# Patient Record
Sex: Female | Born: 1980
Health system: Southern US, Community
[De-identification: ages and names within clinical notes are randomized; demographics above are authoritative.]

## PROBLEM LIST (undated history)

## (undated) DIAGNOSIS — G47419 Narcolepsy without cataplexy: Secondary | ICD-10-CM

## (undated) DIAGNOSIS — G4710 Hypersomnia, unspecified: Secondary | ICD-10-CM

## (undated) DIAGNOSIS — L732 Hidradenitis suppurativa: Secondary | ICD-10-CM

## (undated) DIAGNOSIS — T8339XA Other mechanical complication of intrauterine contraceptive device, initial encounter: Secondary | ICD-10-CM

## (undated) DIAGNOSIS — G4733 Obstructive sleep apnea (adult) (pediatric): Secondary | ICD-10-CM

## (undated) DIAGNOSIS — Z9989 Dependence on other enabling machines and devices: Secondary | ICD-10-CM

## (undated) DIAGNOSIS — R635 Abnormal weight gain: Secondary | ICD-10-CM

## (undated) DIAGNOSIS — J329 Chronic sinusitis, unspecified: Secondary | ICD-10-CM

## (undated) HISTORY — DX: Hidradenitis suppurativa: L73.2

## (undated) HISTORY — PX: OTHER SURGICAL HISTORY: SHX169

## (undated) HISTORY — DX: Abnormal weight gain: R63.5

## (undated) HISTORY — DX: Hypersomnia, unspecified: G47.10

## (undated) HISTORY — PX: TONSILLECTOMY: SUR1361

## (undated) HISTORY — DX: Chronic sinusitis, unspecified: J32.9

## (undated) HISTORY — DX: Obstructive sleep apnea (adult) (pediatric): G47.33

## (undated) HISTORY — DX: Obstructive sleep apnea (adult) (pediatric): Z99.89

## (undated) HISTORY — DX: Narcolepsy without cataplexy: G47.419

---

## 1999-07-18 ENCOUNTER — Other Ambulatory Visit: Admission: RE | Admit: 1999-07-18 | Discharge: 1999-07-18 | Payer: Self-pay | Admitting: *Deleted

## 2000-08-13 ENCOUNTER — Other Ambulatory Visit: Admission: RE | Admit: 2000-08-13 | Discharge: 2000-08-13 | Payer: Self-pay | Admitting: Family Medicine

## 2001-06-10 ENCOUNTER — Ambulatory Visit (HOSPITAL_COMMUNITY): Admission: RE | Admit: 2001-06-10 | Discharge: 2001-06-10 | Payer: Self-pay | Admitting: Obstetrics and Gynecology

## 2001-06-10 ENCOUNTER — Encounter: Payer: Self-pay | Admitting: Obstetrics and Gynecology

## 2001-07-08 ENCOUNTER — Encounter: Payer: Self-pay | Admitting: Obstetrics and Gynecology

## 2001-07-08 ENCOUNTER — Ambulatory Visit (HOSPITAL_COMMUNITY): Admission: RE | Admit: 2001-07-08 | Discharge: 2001-07-08 | Payer: Self-pay | Admitting: Obstetrics and Gynecology

## 2001-09-16 ENCOUNTER — Encounter: Payer: Self-pay | Admitting: Obstetrics and Gynecology

## 2001-09-16 ENCOUNTER — Ambulatory Visit (HOSPITAL_COMMUNITY): Admission: RE | Admit: 2001-09-16 | Discharge: 2001-09-16 | Payer: Self-pay | Admitting: Obstetrics and Gynecology

## 2001-11-02 ENCOUNTER — Inpatient Hospital Stay (HOSPITAL_COMMUNITY): Admission: AD | Admit: 2001-11-02 | Discharge: 2001-11-02 | Payer: Self-pay | Admitting: Obstetrics and Gynecology

## 2001-11-07 ENCOUNTER — Inpatient Hospital Stay (HOSPITAL_COMMUNITY): Admission: AD | Admit: 2001-11-07 | Discharge: 2001-11-07 | Payer: Self-pay | Admitting: Obstetrics and Gynecology

## 2001-11-11 ENCOUNTER — Inpatient Hospital Stay (HOSPITAL_COMMUNITY): Admission: AD | Admit: 2001-11-11 | Discharge: 2001-11-11 | Payer: Self-pay | Admitting: Obstetrics and Gynecology

## 2001-11-15 ENCOUNTER — Inpatient Hospital Stay (HOSPITAL_COMMUNITY): Admission: AD | Admit: 2001-11-15 | Discharge: 2001-11-17 | Payer: Self-pay | Admitting: Obstetrics and Gynecology

## 2001-11-18 ENCOUNTER — Encounter: Admission: RE | Admit: 2001-11-18 | Discharge: 2001-12-18 | Payer: Self-pay | Admitting: Obstetrics and Gynecology

## 2002-01-03 ENCOUNTER — Other Ambulatory Visit: Admission: RE | Admit: 2002-01-03 | Discharge: 2002-01-03 | Payer: Self-pay | Admitting: Obstetrics and Gynecology

## 2003-04-27 ENCOUNTER — Other Ambulatory Visit: Admission: RE | Admit: 2003-04-27 | Discharge: 2003-04-27 | Payer: Self-pay | Admitting: Obstetrics and Gynecology

## 2005-01-09 ENCOUNTER — Other Ambulatory Visit: Admission: RE | Admit: 2005-01-09 | Discharge: 2005-01-09 | Payer: Self-pay | Admitting: Obstetrics and Gynecology

## 2010-01-31 ENCOUNTER — Encounter: Admission: RE | Admit: 2010-01-31 | Discharge: 2010-01-31 | Payer: Self-pay | Admitting: Family Medicine

## 2010-01-31 ENCOUNTER — Encounter: Payer: Self-pay | Admitting: Internal Medicine

## 2010-02-14 ENCOUNTER — Encounter: Payer: Self-pay | Admitting: Internal Medicine

## 2010-03-12 DIAGNOSIS — R05 Cough: Secondary | ICD-10-CM

## 2010-03-14 ENCOUNTER — Ambulatory Visit: Payer: Self-pay | Admitting: Internal Medicine

## 2010-03-14 DIAGNOSIS — G47419 Narcolepsy without cataplexy: Secondary | ICD-10-CM

## 2010-03-14 DIAGNOSIS — G4733 Obstructive sleep apnea (adult) (pediatric): Secondary | ICD-10-CM

## 2010-04-10 ENCOUNTER — Telehealth (INDEPENDENT_AMBULATORY_CARE_PROVIDER_SITE_OTHER): Payer: Self-pay | Admitting: *Deleted

## 2010-05-20 NOTE — Assessment & Plan Note (Signed)
Summary: Pulmonary consultation/ cough ? all uacs   Visit Type:  Initial Consult Copy to:  Dr. Maryruth Hancock [facility] Primary Provider/Referring Provider:  Dr. Maryruth Hancock Mazzocchi  CC:  Cough.  History of Present Illness: 30 yowf active smoker  with no previous resp/atopic problems referred by [facility] for chronic cough   March 14, 2010  1st pulmonary office eval cc acute onset cough with cold in Sept 2011 initially cough  was not that bad  and no rx but  otcs but since persistent cough daily and nightly with no excess mucus.  Prednisone helped more than anything else but not 100% effective, symbicort not effective. Pt denies any significant sore throat, dysphagia, itching, sneezing,  nasal congestion or excess secretions,  fever, chills, sweats, unintended wt loss, pleuritic or exertional cp, hempoptysis, change in activity tolerance  orthopnea pnd or leg swelling Pt also denies any obvious fluctuation in symptoms with weather or environmental change or other alleviating or aggravating factors.       Current Medications (verified): 1)  Nuvigil 250 Mg Tabs (Armodafinil) .Marland Kitchen.. 1 Every Am 2)  Cpap 10 Cm .... With Sleep 3)  Symbicort 160-4.5 Mcg/act Aero (Budesonide-Formoterol Fumarate) .... 2 Puffs Two Times A Day  Allergies (verified): 1)  ! Codeine 2)  ! Joyce Copa D  Past History:  Past Medical History:   HYPERSOMNIA, IDIOPATHIC (ICD-780.54) OBSTRUCTIVE SLEEP APNEA (ICD-327.23) COUGH (ICD-786.2).....................................Marland KitchenWert       - Onset 12/2009  Family History: Breast CA- Maternal Aunt   Social History: Current smoker since age 30.  Smokes 3/4 ppd. ETOH rare Married  Astronomer  Review of Systems       The patient complains of productive cough, irregular heartbeats, weight change, and abdominal pain.  The patient denies shortness of breath with activity, shortness of breath at rest, non-productive cough, coughing up blood, chest pain, acid  heartburn, indigestion, loss of appetite, difficulty swallowing, sore throat, tooth/dental problems, headaches, nasal congestion/difficulty breathing through nose, sneezing, itching, ear ache, anxiety, depression, hand/feet swelling, joint stiffness or pain, rash, change in color of mucus, and fever.    Vital Signs:  Patient profile:   30 year old female Height:      67 inches Weight:      315.50 pounds BMI:     49.59 O2 Sat:      99 % on Room air Temp:     98.3 degrees F oral Pulse rate:   78 / minute BP sitting:   112 / 68  (left arm)  Vitals Entered By: Vernie Murders (March 14, 2010 10:07 AM)  O2 Flow:  Room air  Physical Exam  Additional Exam:  amb wf nad cough variably on insp  wt 315 March 14, 2010  HEENT: nl dentition, turbinates, and orophanx. Nl external ear canals without cough reflex NECK :  without JVD/Nodes/TM/ nl carotid upstrokes bilaterally LUNGS: no acc muscle use, clear to A and P bilaterally without cough on insp or exp maneuvers CV:  RRR  no s3 or murmur or increase in P2, no edema  ABD:  soft and nontender with nl excursion in the supine position. No bruits or organomegaly, bowel sounds nl MS:  warm without deformities, calf tenderness, cyanosis or clubbing SKIN: warm and dry without lesions   NEURO:  alert, approp, no deficits     CXR  Procedure date:  02/14/2010  Findings:      No evidece of acute cariopulmonary disease  CXR  Procedure date:  01/31/2010  Findings:      ? R ll bronchopneumonia  Impression & Recommendations:  Problem # 1:  COUGH (ICD-786.2)  The most common causes of chronic cough in immunocompetent adults include: upper airway cough syndrome (UACS), previously referred to as postnasal drip syndrome,  caused by variety of rhinosinus conditions; (2) asthma; (3) GERD; (4) chronic bronchitis from cigarette smoking or other inhaled environmental irritants; (5) nonasthmatic eosinophilic bronchitis; and (6) bronchiectasis. These  conditions, singly or in combination, have accounted for up to 94% of the causes of chronic cough in prospective studies.  This is not a typical smokers cough in that more dry than wet and no exac early in am with excess mucus    Of the three most common causes of chronic cough, only one (GERD)  can actually cause the other two(asthma and pnds) and perpetuate the cylce of cough inducing airway trauma, inflammation, heightened sensitivity to reflux which is prompted by the cough itself via a cyclical mechanism.  This may partially respond to steroids and look like asthma and post nasal drainage but never erradicated completely unless the cough and the secondary reflux are eliminated, preferably both at the same time.  See instructions for specific recommendations   Orders: Consultation Level V [phone])  Medications Added to Medication List This Visit: 1)  Nuvigil 250 Mg Tabs (Armodafinil) .Marland Kitchen.. 1 every am 2)  Cpap 10 Cm  .... With sleep 3)  Symbicort 160-4.5 Mcg/act Aero (Budesonide-formoterol fumarate) .... 2 puffs two times a day 4)  Aciphex 20 Mg Tbec (Rabeprazole sodium) .... Take  one 30-60 min before first meal of the day 5)  Tramadol Hcl 50 Mg Tabs (Tramadol hcl) .... One to two by mouth every 4-6 hours as needed for cough 6)  Pepcid 20 Mg Tabs (Famotidine) .... Take one by mouth at bedtime  Patient Instructions: 1)  GERD (REFLUX)  is a common cause of respiratory symptoms. It commonly presents without heartburn and can be treated with medication, but also with lifestyle changes including avoidance of late meals, excessive alcohol, smoking cessation, and avoid fatty foods, chocolate, peppermint, colas, red wine, and acidic juices such as orange juice. NO MINT OR MENTHOL PRODUCTS SO NO COUGH DROPS  2)  USE SUGARLESS CANDY INSTEAD (jolley ranchers)  3)  NO OIL BASED VITAMINS  4)  Take delsym two tsp every 12 hours and add tramadol 50 mg up to 2  every 4 hours to suppress the urge to cough.  Swallowing water or using ice chips/non mint and menthol containing candies (such as lifesavers or sugarless jolly ranchers) are also effective.  5)  Aciphex Take  one 30-60 min before first meal of the day and pepcid 20mg  one at bedtime until no coughing for two weeks without the cough  6)  Please schedule a follow-up appointment as needed if not 100% better. 7)  Congratulations on the no smoking! Prescriptions: TRAMADOL HCL 50 MG  TABS (TRAMADOL HCL) One to two by mouth every 4-6 hours as needed for cough  #40 x 0   Entered and Authorized by:   Nyoka Cowden MD   Signed by:   Nyoka Cowden MD on 03/14/2010   Method used:   [facility]        [facility] [address] [phone]* (retail)       [address]       [place], [place]  [address]  Ph: [phone] or [phone]       Fax: [phone]   RxID:   [id number] ACIPHEX 20 MG TBEC (RABEPRAZOLE SODIUM) Take  one 30-60 min before first meal of the day  #34 x 11   Entered and Authorized by:   Nyoka Cowden MD   Signed by:   Nyoka Cowden MD on 03/14/2010   Method used:   [facility]        [facility] [address] [phone] (retail)       [address]       [place], [place]  [address]       Ph: [phone] or [phone]       Fax: [phone]   RxID:   [id number]

## 2010-05-22 NOTE — Progress Notes (Signed)
Summary: Aciphex is covered for 31 day supply  Phone Note Outgoing Call   Call placed by: Michel Bickers CMA,  April 10, 2010 2:58 PM Call placed to: [facility] [phone] Summary of Call: Per ins Aciphex is covered for # 31 in a 24 day period. MW wrote RX for # 34. [facility] [address] at [phone] and have them change RX for # 31 and run it back through. Pharmacy changed the quantity and had no problem with this going through. Pt is also aware of the quantity change. Will update her med list to reflect the quantity change and forward to Dr. Sherene Sires so he is aware. Initial call taken by: Michel Bickers CMA,  April 10, 2010 3:12 PM  Follow-up for Phone Call        ok to substiute for nexium or whatever ppi her insurance will cover or use otc prilosec 20 Take  one 30-60 min before first meal of the day  Follow-up by: Nyoka Cowden MD,  April 10, 2010 4:09 PM  Additional Follow-up for Phone Call Additional follow up Details #1::        Insurance is covering Aciphex for now at #31 a month and pt is fine with this. Okay per Mw to change to Nexium, Prilosec OTC, etc...if needed in the future. Additional Follow-up by: Michel Bickers CMA,  April 10, 2010 4:17 PM    Prescriptions: ACIPHEX 20 MG TBEC (RABEPRAZOLE SODIUM) Take  one 30-60 min before first meal of the day  #31 x 11   Entered by:   Michel Bickers CMA   Authorized by:   Nyoka Cowden MD   Signed by:   Michel Bickers CMA on 04/10/2010   Method used:   Historical   RxID:   [id number]

## 2010-09-05 NOTE — Discharge Summary (Signed)
NAMEMarland Hendricks  Karen, Hendricks                         ACCOUNT NO.:  [id number]   MEDICAL RECORD NO.:  [id number]                   PATIENT TYPE:  INP   LOCATION:  9119                                 FACILITY:  [facility]   PHYSICIAN:  Oliver Pila, M.D.           DATE OF BIRTH:  [birth year]   DATE OF ADMISSION:  11/15/2001  DATE OF DISCHARGE:  11/17/2001                                 DISCHARGE SUMMARY   DISCHARGE DIAGNOSES:  1. Term pregnancy at 32 and 4/7 weeks delivered.  2. Status post normal spontaneous vaginal delivery.  3. Mild pregnancy-induced hypertension.   DISCHARGE MEDICATIONS:  1. Motrin 600 mg p.o. every six hours p.r.n.  2. Percocet 1 to 2 tablets p.o. every four hours p.r.n.   DISCHARGE FOLLOWUP:  The patient is to follow up in six weeks for a routine  postpartum exam.   HOSPITAL COURSE:  The patient is a 30 year old G1, P0 who was admitted at 74  and 4/7 weeks for induction, given post due date and her favorable cervix.  Also, the patient had some borderline elevated blood pressures at her last  few prenatal visits, from the 140s/90s to 150s/low 100s. She had no other  pre-eclamptic symptoms, and her labs were within normal limits.   Prenatal care had been complicated by positive smoking status in the mother,  however, she had cut this down significantly, to five or six cigarettes a  day, as well as some fetal pyelectasis, which was noted early on, however,  on follow-up ultrasound at 32 weeks had resolved.   LABORATORY DATA:  Prenatal labs were as follows:  O-positive, antibody  negative, rubella immune, RPR nonreactive, hepatitis B antigen negative, HIV  negative, GC negative, chlamydia negative, triple screen normal, and GBS  negative.   PAST OB HISTORY:  None.   PAST GYN HISTORY:  None.   PAST SURGICAL HISTORY:  None.   PAST MEDICAL HISTORY:  None.   PHYSICAL EXAMINATION:  On admission she was afebrile with stable vital  signs. Fetal heart rate was  reactive. Cervix was 50, 1+, and a -2 station.  She had ruptured membranes on admission with thick meconium obtained.  Therefore, an IUPC was placed and an amnioinfusion begun. The patient was  placed on Pitocin and progressed well throughout the day. She reached  complete dilation in the evening time and pushed for approximately one hour  with a normal spontaneous vaginal delivery of a viable female infant over a  second-degree laceration. Apgar's were 8 and 9. Weight was 7 pounds 13  ounces. Nuchal cord times one, was reduced overhead after delivery, and then  the infant was DeLee suctioned carefully prior to delivery of the remainder  of the rest of the body. Pediatric was present at delivery and evaluated  infant without any evidence of meconium aspiration.   Secondary laceration was repaired with 2-0 and 3-0 Vicryl. Cervix and rectum  were intact.  ESTIMATED BLOOD LOSS:  Estimated blood loss was 450 cc.   The patient did very well postpartum, and on postpartum day #2 was felt  stable for discharge home. She was afebrile, stable vital signs. Her pain  was well controlled with Motrin and Percocet, therefore she was discharged  home with followup as previously stated.                                               Oliver Pila, M.D.    KR/MEDQ  D:  11/17/2001  T:  11/23/2001  Job:  [id number]

## 2011-01-18 ENCOUNTER — Other Ambulatory Visit: Payer: Self-pay | Admitting: Internal Medicine

## 2011-04-21 HISTORY — PX: NASAL SINUS SURGERY: SHX719

## 2011-12-26 IMAGING — CR DG CHEST 2V
2 series · 2 of 2 positions shown · non-contrast
Comparison: None.

CLINICAL DATA: Cough, fever, smoker

CHEST - 2 VIEW

[w chest pa]
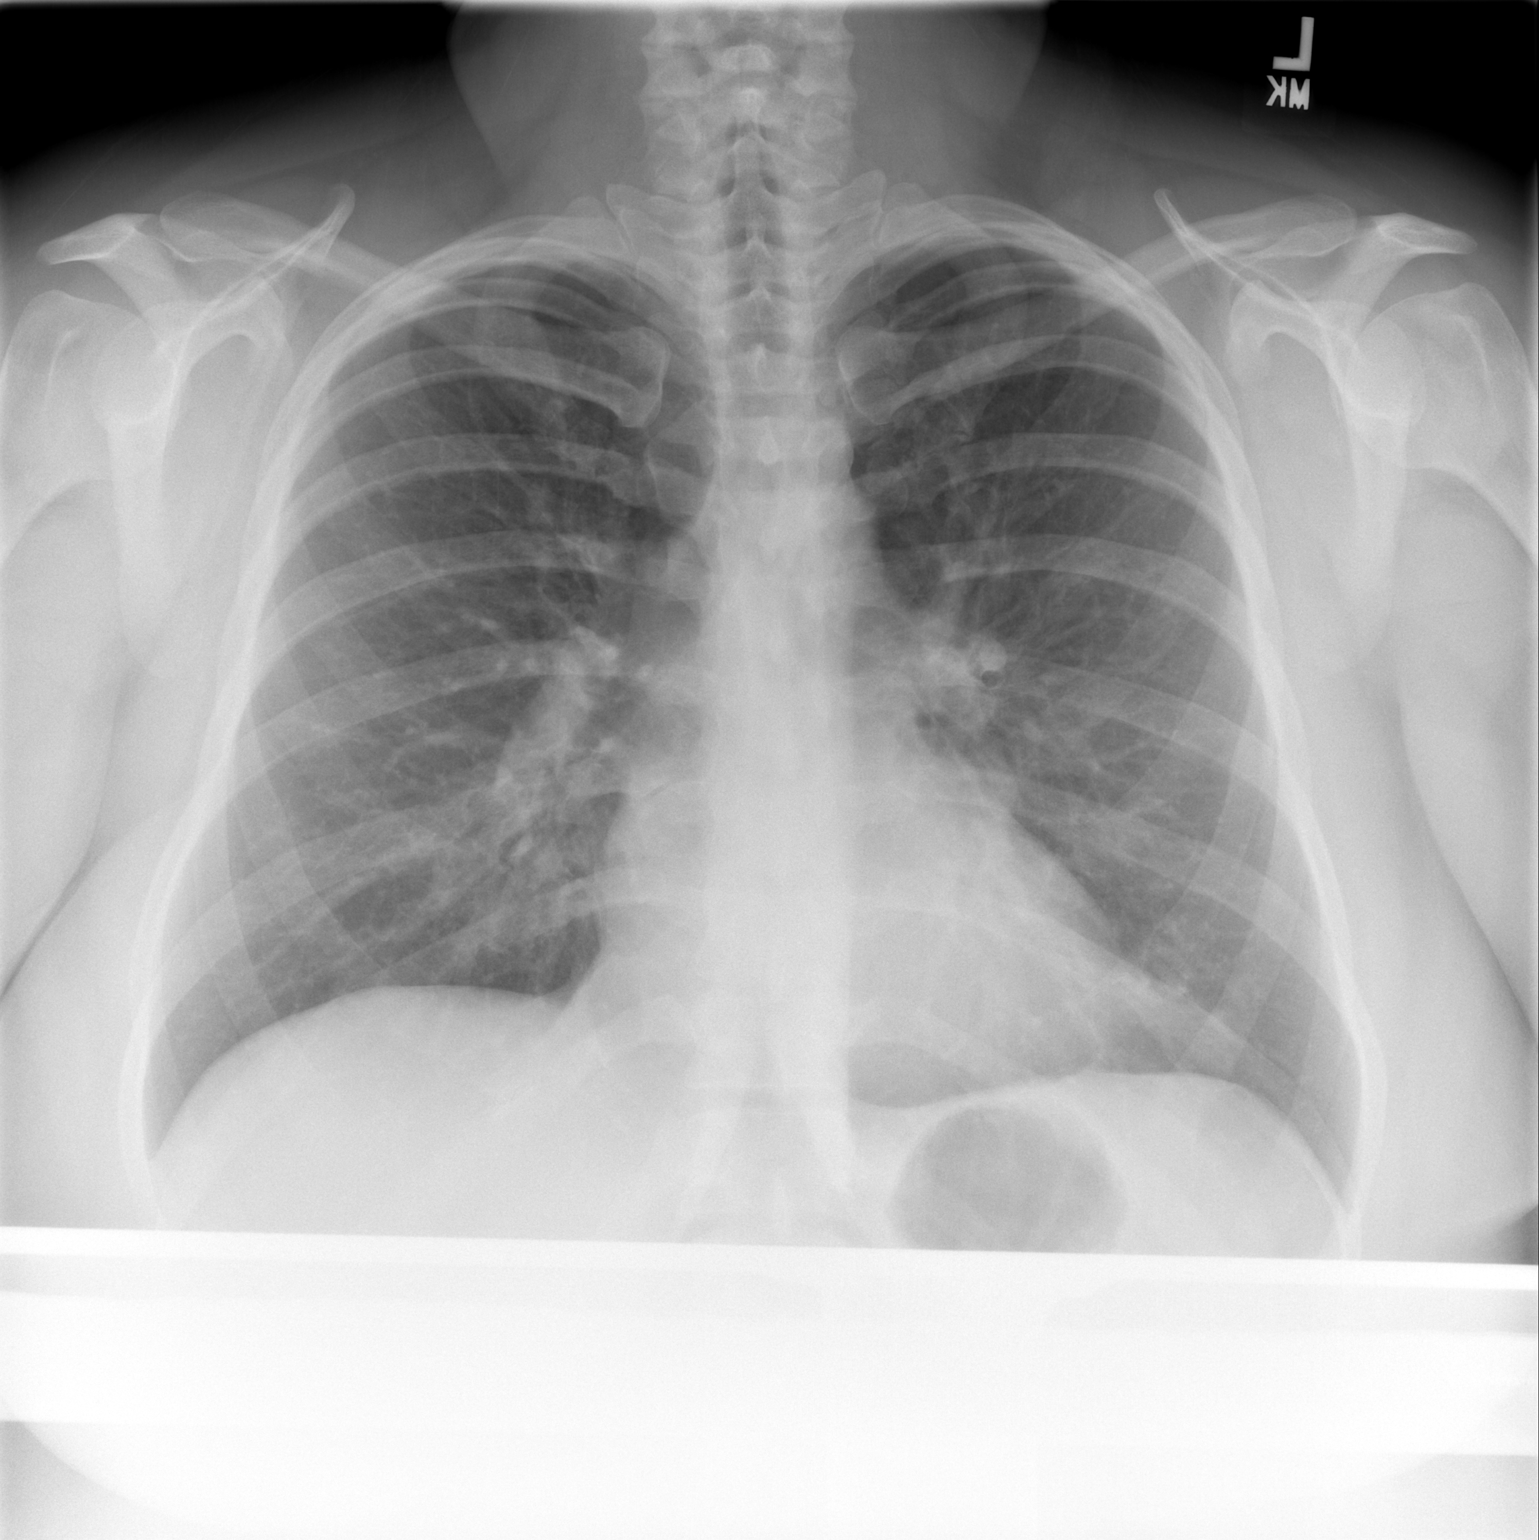

[w chest lat]
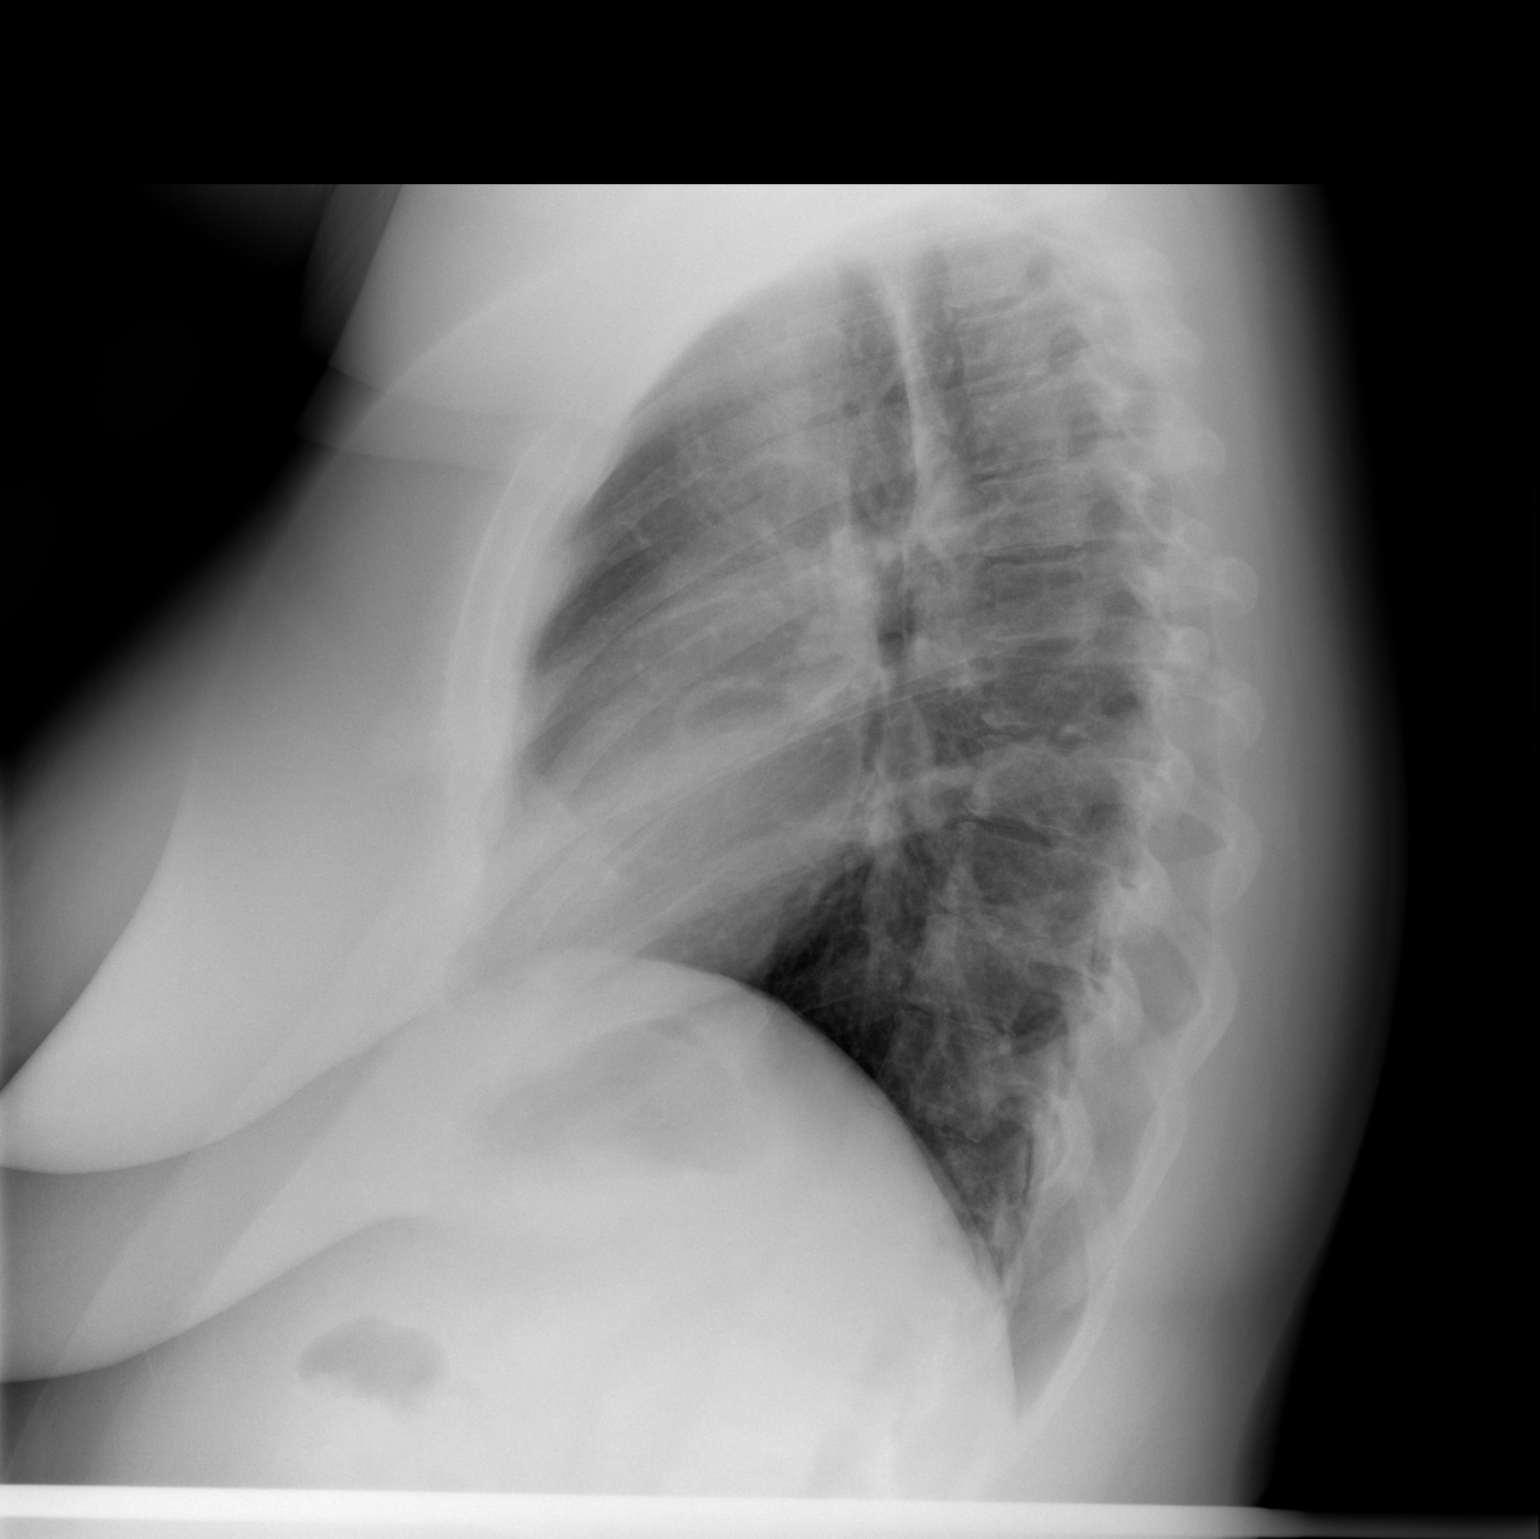

[2 of 2 positions shown; findings below may reference images not displayed]

FINDINGS: Normal heart size and vascularity.  Right infrahilar ill-
defined opacity noted with possible small central air bronchograms,
suspicious for pneumonia.  Left lung demonstrates minimal basilar
atelectasis.  No effusion or pneumothorax.  Negative for edema.
Normal heart size and vascularity.
IMPRESSION: Findings suspicious for right lower lobe infrahilar
bronchopneumonia.
Basilar atelectasis

## 2012-08-04 ENCOUNTER — Other Ambulatory Visit: Payer: Self-pay

## 2012-08-04 MED ORDER — AMPHETAMINE-DEXTROAMPHET ER 20 MG PO CP24: 20.0000 mg | ORAL_CAPSULE | ORAL | Status: DC

## 2012-08-04 NOTE — Telephone Encounter (Signed)
Patient called to request a refill on Adderall.  Patient would like to pick it up Friday.  Call back number [phone].  Dr Dohmeier is out of the office, forwarding request to Dr Eulah Citizen.

## 2012-08-05 MED ORDER — AMPHETAMINE-DEXTROAMPHET ER 20 MG PO CP24: 20.0000 mg | ORAL_CAPSULE | ORAL | Status: DC

## 2012-08-05 NOTE — Addendum Note (Signed)
Addended by: Chianna Spirito C on: 08/05/2012 12:23 PM   Modules accepted: Orders  

## 2012-08-05 NOTE — Telephone Encounter (Signed)
Rx did not print.  I reprinted it.

## 2012-09-14 ENCOUNTER — Other Ambulatory Visit: Payer: Self-pay

## 2012-09-14 NOTE — Telephone Encounter (Signed)
Patient called to request a refill on Adderall.  She would like to pick it up on Friday.  Call back number [phone]

## 2012-09-15 MED ORDER — AMPHETAMINE-DEXTROAMPHET ER 20 MG PO CP24: 20.0000 mg | ORAL_CAPSULE | ORAL | Status: DC

## 2012-09-16 ENCOUNTER — Telehealth: Payer: Self-pay | Admitting: Neurology

## 2012-09-16 NOTE — Telephone Encounter (Signed)
Patient is calling to let us know she called her pharmacy and they do not have her adderall prescription.  Please call the patient asap.

## 2012-09-16 NOTE — Telephone Encounter (Signed)
Adderall is a CII narcotic that must be picked up.  I called the patient back.  She is aware Rx is ready here for pick up.

## 2012-09-23 ENCOUNTER — Other Ambulatory Visit: Payer: Self-pay | Admitting: Obstetrics and Gynecology

## 2012-09-23 DIAGNOSIS — N6452 Nipple discharge: Secondary | ICD-10-CM

## 2012-10-07 ENCOUNTER — Ambulatory Visit
Admission: RE | Admit: 2012-10-07 | Discharge: 2012-10-07 | Disposition: A | Discharge status: Home / Self Care | Payer: 59 | Source: Ambulatory Visit | Attending: Obstetrics and Gynecology | Admitting: Obstetrics and Gynecology

## 2012-10-07 DIAGNOSIS — N6452 Nipple discharge: Secondary | ICD-10-CM

## 2012-10-24 ENCOUNTER — Other Ambulatory Visit: Payer: Self-pay

## 2012-10-24 MED ORDER — AMPHETAMINE-DEXTROAMPHET ER 20 MG PO CP24: 20.0000 mg | ORAL_CAPSULE | ORAL | Status: DC

## 2012-10-24 NOTE — Telephone Encounter (Signed)
Patient called, left message requesting a refill on Adderall.  She asked that the Rx be mailed to her when it's ready.  Dr Dohmeier is out of the office, forwarding request to Dr Anne Hahn, [facility] this morning.

## 2012-12-15 ENCOUNTER — Other Ambulatory Visit: Payer: Self-pay

## 2012-12-15 MED ORDER — AMPHETAMINE-DEXTROAMPHET ER 20 MG PO CP24: 20.0000 mg | ORAL_CAPSULE | ORAL | Status: DC

## 2012-12-15 NOTE — Telephone Encounter (Signed)
Patient called requesting a refill on Adderall.  Said she would like to pick it up tomorrow if it's ready.  Call back number [phone].

## 2012-12-16 ENCOUNTER — Ambulatory Visit (INDEPENDENT_AMBULATORY_CARE_PROVIDER_SITE_OTHER): Payer: 59 | Admitting: Physician Assistant

## 2012-12-16 VITALS — BP 142/88 | HR 110 | Temp 98.8°F | Resp 18 | Ht 67.5 in | Wt 288.8 lb

## 2012-12-16 DIAGNOSIS — R05 Cough: Secondary | ICD-10-CM

## 2012-12-16 DIAGNOSIS — J329 Chronic sinusitis, unspecified: Secondary | ICD-10-CM

## 2012-12-16 DIAGNOSIS — K5900 Constipation, unspecified: Secondary | ICD-10-CM

## 2012-12-16 MED ORDER — ALBUTEROL SULFATE HFA 108 (90 BASE) MCG/ACT IN AERS: 2.0000 | INHALATION_SPRAY | Freq: Four times a day (QID) | RESPIRATORY_TRACT | Status: DC | PRN

## 2012-12-16 MED ORDER — TRIAMCINOLONE ACETONIDE(NASAL) 55 MCG/ACT NA INHA: 2.0000 | Freq: Every day | NASAL | Status: DC

## 2012-12-16 MED ORDER — AMOXICILLIN 875 MG PO TABS: 875.0000 mg | ORAL_TABLET | Freq: Two times a day (BID) | ORAL | Status: DC

## 2012-12-16 MED ORDER — POLYETHYLENE GLYCOL 3350 17 GM/SCOOP PO POWD: 17.0000 g | Freq: Three times a day (TID) | ORAL | Status: DC

## 2012-12-16 NOTE — Telephone Encounter (Signed)
Rx signed, ready for pick up.  I called the patient.  She is aware.  

## 2012-12-16 NOTE — Progress Notes (Signed)
   [address], [place] [address]   Phone [phone]  Subjective:    Patient ID: Karen Hendricks, female    DOB: 05-Oct-1980, 32 y.o.   MRN: [id number]  HPI Pt presents to clinic with sinus congestion and pain in the R max sinus.  She had a cold last week and thought it was getting better but now she is having pain in her sinus and is worried because she had to have sinus surgery for obstructing polyps 1.5 years ago.  She has clear rhinorrhea and PND with tickle like cough.  She has needed an inhaler before and this cough feels similar but a little different.  She is also concerned because for the last several months she has been constipated and the only way she can go to the bathroom is to use a stimulate laxative, then she has diarrhea.  She is using this every other day and if she does not use it she gets bloated and does not feel good.  She has not had anything change in the last several months - she has not have change in color or bleeding.  She has tried fiber supplements without help.  She has tried daily dosing of Miralax without help.  She drinks a lot of water daily and urinated a lot.  Review of Systems  Constitutional: Negative for fever and chills.  HENT: Positive for congestion, rhinorrhea (clear) and postnasal drip. Negative for sore throat.   Respiratory: Positive for cough.   Gastrointestinal: Positive for constipation. Negative for blood in stool.       Objective:   Physical Exam  Constitutional: She is oriented to person, place, and time. She appears well-developed and well-nourished.  HENT:  Head: Normocephalic and atraumatic.  Right Ear: Hearing, tympanic membrane, external ear and ear canal normal.  Left Ear: Hearing, tympanic membrane, external ear and ear canal normal.  Nose: Mucosal edema (pale//? polyp on the right side) present.  Mouth/Throat: Uvula is midline and oropharynx is clear and moist.  Cardiovascular: Normal rate, regular rhythm and normal heart  sounds.   No murmur heard. Pulmonary/Chest: Effort normal.  Neurological: She is alert and oriented to person, place, and time.  Skin: Skin is warm and dry.  Psychiatric: She has a normal mood and affect. Her behavior is normal. Judgment and thought content normal.       Assessment & Plan:  Unspecified constipation - Plan: polyethylene glycol powder (GLYCOLAX/MIRALAX) powder, Ambulatory referral to Gastroenterology  Sinusitis - Plan: amoxicillin (AMOXIL) 875 MG tablet, triamcinolone (NASACORT) 55 MCG/ACT nasal inhaler  Cough - Plan: albuterol (PROVENTIL HFA;VENTOLIN HFA) 108 (90 BASE) MCG/ACT inhaler  Will cover with antibiotics due to her history of sinus problems.  Will start her on tid Miralax to see if we can decrease her dependence on stimulant laxatives - will do a referral to GI due to length of problem.  She will call ENT if her sinus symptoms do not resolve.  Benny Lennert PA-C 12/16/2012 8:04 PM

## 2013-02-09 ENCOUNTER — Other Ambulatory Visit: Payer: Self-pay

## 2013-02-09 MED ORDER — AMPHETAMINE-DEXTROAMPHET ER 20 MG PO CP24: 20.0000 mg | ORAL_CAPSULE | ORAL | Status: DC

## 2013-02-09 NOTE — Telephone Encounter (Signed)
Patient called requesting a refill on Adderall.  She would like to pick up the Rx when it's ready.  Call back number [phone].

## 2013-02-10 ENCOUNTER — Telehealth: Payer: Self-pay

## 2013-02-10 NOTE — Telephone Encounter (Signed)
Patient was called and came to pick up her medication.

## 2013-02-24 ENCOUNTER — Telehealth: Payer: Self-pay | Admitting: Neurology

## 2013-02-27 ENCOUNTER — Telehealth: Payer: Self-pay

## 2013-02-27 NOTE — Telephone Encounter (Signed)
Patient states that pharmacy was concerned re: dosing because she had not had her xyrem filled in a while. She is on 4.5 gm 2x night but often will not wake to the second dose or will miss it due to illness,etc. Patient wants to confirm that Dr. Vickey Hendricks will continue to fill at this previous dose. I let patient know I will forward the information so there is no issue.

## 2013-02-28 NOTE — Telephone Encounter (Signed)
Pt's prescription was faxed over to [facility] pharmacy at [phone].

## 2013-03-09 ENCOUNTER — Ambulatory Visit: Payer: Self-pay | Admitting: Neurology

## 2013-03-10 ENCOUNTER — Ambulatory Visit: Payer: Self-pay | Admitting: Neurology

## 2013-04-07 ENCOUNTER — Other Ambulatory Visit: Payer: Self-pay

## 2013-04-07 MED ORDER — AMPHETAMINE-DEXTROAMPHET ER 20 MG PO CP24: 20.0000 mg | ORAL_CAPSULE | ORAL | Status: DC

## 2013-04-07 NOTE — Telephone Encounter (Signed)
Called patient to let her know Rx will be available for p/u on Monday morning. She asked that we allow Hillery or Harlene Salts to pick it up for her. I will put their names on envelope.

## 2013-04-07 NOTE — Telephone Encounter (Signed)
Patient calle requesting a refill on Adderall.  She would like the Rx mailed to her when it's ready.

## 2013-04-10 ENCOUNTER — Encounter: Payer: Self-pay | Admitting: Neurology

## 2013-04-17 ENCOUNTER — Ambulatory Visit (INDEPENDENT_AMBULATORY_CARE_PROVIDER_SITE_OTHER): Payer: 59 | Admitting: Neurology

## 2013-04-17 ENCOUNTER — Encounter: Payer: Self-pay | Admitting: Neurology

## 2013-04-17 ENCOUNTER — Encounter (INDEPENDENT_AMBULATORY_CARE_PROVIDER_SITE_OTHER): Payer: Self-pay

## 2013-04-17 VITALS — BP 126/85 | HR 109 | Resp 16 | Ht 67.0 in | Wt 306.0 lb

## 2013-04-17 DIAGNOSIS — G4710 Hypersomnia, unspecified: Secondary | ICD-10-CM

## 2013-04-17 DIAGNOSIS — Z5181 Encounter for therapeutic drug level monitoring: Secondary | ICD-10-CM | POA: Insufficient documentation

## 2013-04-17 DIAGNOSIS — G4733 Obstructive sleep apnea (adult) (pediatric): Secondary | ICD-10-CM

## 2013-04-17 HISTORY — DX: Hypersomnia, unspecified: G47.10

## 2013-04-17 MED ORDER — SODIUM OXYBATE 500 MG/ML PO SOLN: 4500.0000 mg | Freq: Two times a day (BID) | ORAL | Status: DC

## 2013-04-17 NOTE — Progress Notes (Signed)
[facility]  Provider:  Melvyn Novas, M D  Referring Provider: No ref. provider found Primary Care Physician:  Herb Grays, MD  Chief Complaint  Patient presents with  . Follow-up    per EMR, Rm 10    HPI:  Karen Hendricks is a 32 y.o. female  Is seen here as a  revisit  from Dr. Collins Scotland  for CPAP  yearly compliance visit :    I have followed this patient since 2012 for a sleep problem. The patient was diagnosed with obstructive sleep apnea in 2010 at the time she was titrated to 11 cm a diagnostic polysomnography performed on 06-07-08 documented an AHI of 8.3 but an associated oxygen nadir of 69%. At the time Coble might dioxide was not measured , but we  assumed strongly that there was a component of  obesity hypoventilation.  The patient also complained of insomnia at the time -she was using her CPAP machine compliantly over the last 4 years and since her excessive daytime sleepiness did not improve with CPAP alone was followed by MS LT study on 10-26-2008 ,which showed an amazingly short mean sleep latency of 2.9 minutes, no sleep onset REM periods-  this followed a PSG -Night of  normal polysomnography on CPAP. Pressure of 11 cm water.    Review of Systems: Out of a complete 14 system review, the patient complains of only the following symptoms, and all other reviewed systems are negative.  Patient endorsed constipation low-grade fevers at night frequent awakening daytime sleepiness which still persists in spite of CPAP he was and in spite of Xyrem he was for the treatment of narcolepsy and excessive daytime sleepiness. The patient is at the upper use or alcohol use also rarely she denies any recreational drug use. She had no intermittent family medical history or personal change in medical history the patient is already on Adderall XR Xyrem and CPAP.  Today's Epworth sleepiness score was endorsed at 9 points and the fatigue severity score of 55 points the sleep study  and MS LT results were attached to today's paperwork . Refill of  for Xyrem today for the patient.  BMI is still 48 but reduced in comparison to last year's 50.14.  History   Social History  . Marital Status: Married    Spouse Name: Karen Hendricks    Number of Children: 1  . Years of Education: 12   Occupational History  . dental hygienist    Social History Main Topics  . Smoking status: Current Every Day Smoker  . Smokeless tobacco: Not on file  . Alcohol Use: Yes  . Drug Use: No  . Sexual Activity: Not on file   Other Topics Concern  . Not on file   Social History Narrative  . No narrative on file    History reviewed. No pertinent family history.  Past Medical History  Diagnosis Date  . Narcolepsy   . Hypersomnia   . OSA on CPAP   . Sinus infection     Past Surgical History  Procedure Laterality Date  . Nasal sinus surgery  jan 2013  . Tonsillectomy    . Adenoid surgery      Current Outpatient Prescriptions  Medication Sig Dispense Refill  . amphetamine-dextroamphetamine (ADDERALL XR) 20 MG 24 hr capsule Take 1 capsule (20 mg total) by mouth every morning. Brand Medically Necessary  30 capsule  0  . polyethylene glycol powder (GLYCOLAX/MIRALAX) powder Take 17 g by mouth daily.      Marland Kitchen  Sodium Oxybate (XYREM PO) Take 4.5 g by mouth.       No current facility-administered medications for this visit.    Allergies as of 04/17/2013 - Review Complete 04/17/2013  Allergen Reaction Noted  . Codeine    . Doxycycline Swelling 12/16/2012    Vitals: BP 126/85  Pulse 109  Resp 16  Ht 5\' 7"  (1.702 m)  Wt 306 lb (138.801 kg)  BMI 47.92 kg/m2  LMP 04/09/2013 Last Weight:  Wt Readings from Last 1 Encounters:  04/17/13 306 lb (138.801 kg)   Last Height:   Ht Readings from Last 1 Encounters:  04/17/13 5\' 7"  (1.702 m)    Physical exam:  General: The patient is awake, alert and appears not in acute distress. The patient is well groomed. Head: Normocephalic,  atraumatic. Neck is supple. Mallampati 3 , neck circumference:17, no TMJ  Cardiovascular:  Regular rate and rhythm, fast , without  murmurs or carotid bruit, and without distended neck veins. Respiratory: Lungs are clear to auscultation. Skin:  Without evidence of edema, or rash Trunk: BMI is  elevated and patient  has normal posture.  Neurologic exam : The patient is awake and alert, oriented to place and time.  Memory subjective described as intact. There is a normal attention span & concentration ability. Speech is fluent without   dysarthria, dysphonia or aphasia. Mood and affect are appropriate.  Cranial nerves: Pupils are equal and briskly reactive to light. Funduscopic exam without  evidence of pallor or edema. Extraocular movements  in vertical and horizontal planes intact and without nystagmus. Visual fields by finger perimetry are intact. Hearing to finger rub intact.  Facial sensation intact to fine touch. Facial motor strength is symmetric and tongue and uvula move midline.  Motor exam:   Normal tone and normal muscle bulk and symmetric normal strength in all extremities.  Sensory:  Fine touch, pinprick and vibration were tested in all extremities. Proprioception is normal.  Coordination: Rapid alternating movements in the fingers/hands is tested and normal. Finger-to-nose maneuver tested and normal without evidence of ataxia, dysmetria or tremor.  Gait and station: Patient walks without assistive device . Deep tendon reflexes: in the  upper and lower extremities are symmetric and intact. Babinski  downgoing.   Assessment:   After physical and neurologic examination, review of laboratory studies, imaging, neurophysiology testing and pre-existing records, assessment is : narcolepsy  Based on clinical symptoms and hypersomnia degree during MSLT.  Plan:  OSA on CPAP. compliance record only needed once yearly ,  Due next week . [facility] is DME.  Percell Boston refills , Labs to be drawn every 6  month with LFT>

## 2013-04-21 ENCOUNTER — Ambulatory Visit: Payer: Self-pay | Admitting: Neurology

## 2013-04-28 ENCOUNTER — Encounter: Payer: Self-pay | Admitting: Neurology

## 2013-09-04 ENCOUNTER — Encounter (HOSPITAL_COMMUNITY): Payer: Self-pay | Admitting: Pharmacist

## 2013-09-05 ENCOUNTER — Encounter (HOSPITAL_COMMUNITY): Payer: Self-pay | Admitting: Obstetrics and Gynecology

## 2013-09-05 DIAGNOSIS — T8339XA Other mechanical complication of intrauterine contraceptive device, initial encounter: Secondary | ICD-10-CM

## 2013-09-05 HISTORY — DX: Other mechanical complication of intrauterine contraceptive device, initial encounter: T83.39XA

## 2013-09-05 NOTE — H&P (Signed)
Karen Hendricks is an 33 y.o. female G1P1 s/p attempted IUD removal in office. Arm of IUD retained in cervix, confirmed by US.  Attempted removal in office unsuccessful.  Pt desire removal, d/w pt r/b/a  Pertinent Gynecological History: G1P1 11/15/01 7#12 SVD female + abn pap - ASCUS ne HR HPV, f/u WNL No STDs No LMP recorded.    Past Medical History  Diagnosis Date  . Narcolepsy   . Hypersomnia   . OSA on CPAP   . Sinus infection   . Hypersomnia, persistent 04/17/2013    Persistent hypersomnia on CPAP, abnormal MSLT - XYREM controls hypersomnia now, remaining on CPAP.   . IUD mechanical complication 09/05/2013  ADHD Migraine - rare H/o galactorrhea  Past Surgical History  Procedure Laterality Date  . Nasal sinus surgery  jan 2013  . Tonsillectomy    . Adenoid surgery     FH: Br CA, CAD  Social History:  reports that she has been smoking.  She does not have any smokeless tobacco history on file. She reports that she drinks alcohol. She reports that she does not use illicit drugs. 1/2 ppd, occ ETOH.  Dental hygeniest,married  Allergies:  Allergies  Allergen Reactions  . Codeine Itching    REACTION: itch insomnia  . Doxycycline Swelling    Angioedema Lips swell    No prescriptions prior to admission  Xyrema, ventolin,adderall  Review of Systems  Constitutional: Negative.   HENT: Negative.   Eyes: Negative.   Respiratory: Negative.   Cardiovascular: Negative.   Gastrointestinal: Negative.   Genitourinary: Negative.   Musculoskeletal: Negative.   Skin: Negative.   Neurological: Negative.   Psychiatric/Behavioral: Negative.     There were no vitals taken for this visit. Physical Exam  Constitutional: She is oriented to person, place, and time. She appears well-developed and well-nourished.  HENT:  Head: Normocephalic and atraumatic.  Cardiovascular: Normal rate and regular rhythm.   Respiratory: Effort normal and breath sounds normal. No respiratory distress.  She has no wheezes.  GI: Soft. Bowel sounds are normal. She exhibits no distension. There is no tenderness.  Musculoskeletal: Normal range of motion.  Neurological: She is alert and oriented to person, place, and time.  Skin: Skin is warm and dry.  Psychiatric: She has a normal mood and affect. Her behavior is normal.    US reveals ID arem embedded in cervix  Assessment/Plan: 32yo G1P1 s/p attempted IUD removal - arm retained in cervix for Hysteroscopy removal of IUD arm. D/w pt r/b/a - wishes to proceed.  Vannie Hilgert Bovard-Stuckert 09/05/2013, 9:12 PM

## 2013-09-06 ENCOUNTER — Encounter (HOSPITAL_COMMUNITY): Payer: Self-pay | Admitting: Anesthesiology

## 2013-09-06 NOTE — Anesthesia Preprocedure Evaluation (Addendum)
Anesthesia Evaluation  Patient identified by MRN, date of birth, ID band Patient awake    Reviewed: Allergy & Precautions, H&P , NPO status , Patient's Chart, lab work & pertinent test results  Airway Mallampati: III TM Distance: >3 FB Neck ROM: Full    Dental no notable dental hx. (+) Teeth Intact   Pulmonary sleep apnea and Continuous Positive Airway Pressure Ventilation , Current Smoker,  breath sounds clear to auscultation  Pulmonary exam normal       Cardiovascular negative cardio ROS  Rhythm:Regular Rate:Normal     Neuro/Psych Narcolepsy negative psych ROS   GI/Hepatic negative GI ROS, Neg liver ROS,   Endo/Other  Morbid obesity  Renal/GU negative Renal ROS  negative genitourinary   Musculoskeletal negative musculoskeletal ROS (+)   Abdominal (+) + obese,   Peds  Hematology negative hematology ROS (+)   Anesthesia Other Findings   Reproductive/Obstetrics Mechanical complication of IUD                          Anesthesia Physical Anesthesia Plan  ASA: III  Anesthesia Plan: MAC   Post-op Pain Management:    Induction: Intravenous  Airway Management Planned: Nasal Cannula  Additional Equipment:   Intra-op Plan:   Post-operative Plan: Extubation in OR  Informed Consent: I have reviewed the patients History and Physical, chart, labs and discussed the procedure including the risks, benefits and alternatives for the proposed anesthesia with the patient or authorized representative who has indicated his/her understanding and acceptance.   Dental advisory given  Plan Discussed with: CRNA, Anesthesiologist and Surgeon  Anesthesia Plan Comments:         Anesthesia Quick Evaluation

## 2013-09-07 ENCOUNTER — Ambulatory Visit (HOSPITAL_COMMUNITY): Payer: 59 | Admitting: Anesthesiology

## 2013-09-07 ENCOUNTER — Ambulatory Visit (HOSPITAL_COMMUNITY)
Admission: RE | Admit: 2013-09-07 | Discharge: 2013-09-07 | Disposition: A | Discharge status: Home / Self Care | Payer: 59 | Source: Ambulatory Visit | Attending: Obstetrics and Gynecology | Admitting: Obstetrics and Gynecology

## 2013-09-07 ENCOUNTER — Encounter (HOSPITAL_COMMUNITY)
Admission: RE | Disposition: A | Discharge status: Home / Self Care | Payer: Self-pay | Source: Ambulatory Visit | Attending: Obstetrics and Gynecology

## 2013-09-07 ENCOUNTER — Encounter (HOSPITAL_COMMUNITY): Payer: 59 | Admitting: Anesthesiology

## 2013-09-07 ENCOUNTER — Encounter (HOSPITAL_COMMUNITY): Payer: Self-pay | Admitting: Anesthesiology

## 2013-09-07 DIAGNOSIS — G4733 Obstructive sleep apnea (adult) (pediatric): Secondary | ICD-10-CM | POA: Insufficient documentation

## 2013-09-07 DIAGNOSIS — F17200 Nicotine dependence, unspecified, uncomplicated: Secondary | ICD-10-CM | POA: Insufficient documentation

## 2013-09-07 DIAGNOSIS — F909 Attention-deficit hyperactivity disorder, unspecified type: Secondary | ICD-10-CM | POA: Insufficient documentation

## 2013-09-07 DIAGNOSIS — Y849 Medical procedure, unspecified as the cause of abnormal reaction of the patient, or of later complication, without mention of misadventure at the time of the procedure: Secondary | ICD-10-CM | POA: Insufficient documentation

## 2013-09-07 DIAGNOSIS — G4710 Hypersomnia, unspecified: Secondary | ICD-10-CM | POA: Insufficient documentation

## 2013-09-07 DIAGNOSIS — T8339XA Other mechanical complication of intrauterine contraceptive device, initial encounter: Secondary | ICD-10-CM | POA: Insufficient documentation

## 2013-09-07 DIAGNOSIS — Z881 Allergy status to other antibiotic agents status: Secondary | ICD-10-CM | POA: Insufficient documentation

## 2013-09-07 DIAGNOSIS — Z30432 Encounter for removal of intrauterine contraceptive device: Secondary | ICD-10-CM | POA: Insufficient documentation

## 2013-09-07 DIAGNOSIS — G47419 Narcolepsy without cataplexy: Secondary | ICD-10-CM | POA: Insufficient documentation

## 2013-09-07 DIAGNOSIS — Z885 Allergy status to narcotic agent status: Secondary | ICD-10-CM | POA: Insufficient documentation

## 2013-09-07 HISTORY — DX: Other mechanical complication of intrauterine contraceptive device, initial encounter: T83.39XA

## 2013-09-07 HISTORY — PX: HYSTEROSCOPY: SHX211

## 2013-09-07 LAB — CBC
HCT: 41.7 % (ref 36.0–46.0)
Hemoglobin: 14.3 g/dL (ref 12.0–15.0)
MCH: 32.4 pg (ref 26.0–34.0)
MCHC: 34.3 g/dL (ref 30.0–36.0)
MCV: 94.3 fL (ref 78.0–100.0)
PLATELETS: 241 10*3/uL (ref 150–400)
RBC: 4.42 MIL/uL (ref 3.87–5.11)
RDW: 13.9 % (ref 11.5–15.5)
WBC: 7.5 10*3/uL (ref 4.0–10.5)

## 2013-09-07 LAB — PREGNANCY, URINE: Preg Test, Ur: NEGATIVE

## 2013-09-07 SURGERY — HYSTEROSCOPY
Anesthesia: Monitor Anesthesia Care

## 2013-09-07 MED ORDER — GLYCINE 1.5 % IR SOLN
Status: DC | PRN
  Administered 2013-09-07: 3000 mL

## 2013-09-07 MED ORDER — METOCLOPRAMIDE HCL 5 MG/ML IJ SOLN: 10.0000 mg | Freq: Once | INTRAMUSCULAR | Status: DC | PRN

## 2013-09-07 MED ORDER — MEPERIDINE HCL 25 MG/ML IJ SOLN: 6.2500 mg | INTRAMUSCULAR | Status: DC | PRN

## 2013-09-07 MED ORDER — LIDOCAINE HCL (CARDIAC) 20 MG/ML IV SOLN
INTRAVENOUS | Status: DC | PRN
  Administered 2013-09-07: 50 mg via INTRAVENOUS

## 2013-09-07 MED ORDER — LIDOCAINE HCL 1 % IJ SOLN
INTRAMUSCULAR | Status: DC | PRN
  Administered 2013-09-07: 16 mL

## 2013-09-07 MED ORDER — FENTANYL CITRATE 0.05 MG/ML IJ SOLN
INTRAMUSCULAR | Status: DC | PRN
  Administered 2013-09-07 (×2): 50 ug via INTRAVENOUS

## 2013-09-07 MED ORDER — ONDANSETRON HCL 4 MG/2ML IJ SOLN
INTRAMUSCULAR | Status: DC | PRN
  Administered 2013-09-07: 4 mg via INTRAVENOUS

## 2013-09-07 MED ORDER — LACTATED RINGERS IV SOLN
INTRAVENOUS | Status: DC
  Administered 2013-09-07: 07:00:00 via INTRAVENOUS

## 2013-09-07 MED ORDER — LIDOCAINE HCL 1 % IJ SOLN
INTRAMUSCULAR | Status: AC
  Filled 2013-09-07: qty 20

## 2013-09-07 MED ORDER — KETOROLAC TROMETHAMINE 30 MG/ML IJ SOLN
INTRAMUSCULAR | Status: DC | PRN
  Administered 2013-09-07: 30 mg via INTRAVENOUS

## 2013-09-07 MED ORDER — ONDANSETRON HCL 4 MG/2ML IJ SOLN
INTRAMUSCULAR | Status: AC
  Filled 2013-09-07: qty 2

## 2013-09-07 MED ORDER — IBUPROFEN 800 MG PO TABS: 800.0000 mg | ORAL_TABLET | Freq: Three times a day (TID) | ORAL | Status: DC | PRN

## 2013-09-07 MED ORDER — MIDAZOLAM HCL 2 MG/2ML IJ SOLN
INTRAMUSCULAR | Status: DC | PRN
  Administered 2013-09-07: 2 mg via INTRAVENOUS

## 2013-09-07 MED ORDER — FENTANYL CITRATE 0.05 MG/ML IJ SOLN
INTRAMUSCULAR | Status: AC
  Filled 2013-09-07: qty 2

## 2013-09-07 MED ORDER — LIDOCAINE HCL (CARDIAC) 20 MG/ML IV SOLN
INTRAVENOUS | Status: AC
  Filled 2013-09-07: qty 5

## 2013-09-07 MED ORDER — PROPOFOL 10 MG/ML IV EMUL
INTRAVENOUS | Status: AC
  Filled 2013-09-07: qty 40

## 2013-09-07 MED ORDER — FENTANYL CITRATE 0.05 MG/ML IJ SOLN: 25.0000 ug | INTRAMUSCULAR | Status: DC | PRN

## 2013-09-07 MED ORDER — MIDAZOLAM HCL 2 MG/2ML IJ SOLN
INTRAMUSCULAR | Status: AC
  Filled 2013-09-07: qty 2

## 2013-09-07 MED ORDER — OXYCODONE-ACETAMINOPHEN 5-325 MG PO TABS: 1.0000 | ORAL_TABLET | Freq: Four times a day (QID) | ORAL | Status: DC | PRN

## 2013-09-07 MED ORDER — KETOROLAC TROMETHAMINE 30 MG/ML IJ SOLN
INTRAMUSCULAR | Status: AC
  Filled 2013-09-07: qty 1

## 2013-09-07 MED ORDER — PROPOFOL 10 MG/ML IV EMUL
INTRAVENOUS | Status: DC | PRN
  Administered 2013-09-07: 30 mg via INTRAVENOUS
  Administered 2013-09-07: 50 mg via INTRAVENOUS
  Administered 2013-09-07: 20 mg via INTRAVENOUS
  Administered 2013-09-07: 40 mg via INTRAVENOUS
  Administered 2013-09-07: 50 mg via INTRAVENOUS
  Administered 2013-09-07: 20 mg via INTRAVENOUS
  Administered 2013-09-07 (×2): 30 mg via INTRAVENOUS

## 2013-09-07 SURGICAL SUPPLY — 24 items
ABLATOR ENDOMETRIAL BIPOLAR (ABLATOR) IMPLANT
CANISTER SUCT 3000ML (MISCELLANEOUS) ×3 IMPLANT
CATH ROBINSON RED A/P 16FR (CATHETERS) ×3 IMPLANT
CLOTH BEACON ORANGE TIMEOUT ST (SAFETY) ×3 IMPLANT
CONTAINER PREFILL 10% NBF 60ML (FORM) ×6 IMPLANT
DRAPE HYSTEROSCOPY (DRAPE) ×3 IMPLANT
DRSG TELFA 3X8 NADH (GAUZE/BANDAGES/DRESSINGS) ×3 IMPLANT
ELECT REM PT RETURN 9FT ADLT (ELECTROSURGICAL)
ELECTRODE REM PT RTRN 9FT ADLT (ELECTROSURGICAL) IMPLANT
GLOVE BIO SURGEON STRL SZ 6.5 (GLOVE) ×2 IMPLANT
GLOVE BIO SURGEONS STRL SZ 6.5 (GLOVE) ×1
GLOVE INDICATOR 7.0 STRL GRN (GLOVE) ×3 IMPLANT
GOWN STRL REUS W/TWL LRG LVL3 (GOWN DISPOSABLE) ×6 IMPLANT
LOOP ANGLED CUTTING 22FR (CUTTING LOOP) IMPLANT
NDL SPNL 22GX3.5 QUINCKE BK (NEEDLE) ×1 IMPLANT
NEEDLE SPNL 22GX3.5 QUINCKE BK (NEEDLE) ×3 IMPLANT
PACK VAGINAL MINOR WOMEN LF (CUSTOM PROCEDURE TRAY) ×3 IMPLANT
PAD DRESSING TELFA 3X8 NADH (GAUZE/BANDAGES/DRESSINGS) ×1 IMPLANT
PAD OB MATERNITY 4.3X12.25 (PERSONAL CARE ITEMS) ×3 IMPLANT
SET TUBING HYSTEROSCOPY 2 NDL (TUBING) ×2 IMPLANT
SYR CONTROL 10ML LL (SYRINGE) ×3 IMPLANT
TOWEL OR 17X24 6PK STRL BLUE (TOWEL DISPOSABLE) ×6 IMPLANT
TUBE HYSTEROSCOPY W Y-CONNECT (TUBING) ×2 IMPLANT
WATER STERILE IRR 1000ML POUR (IV SOLUTION) ×3 IMPLANT

## 2013-09-07 NOTE — Transfer of Care (Signed)
Immediate Anesthesia Transfer of Care Note  Patient: Karen Hendricks  Procedure(s) Performed: Procedure(s): HYSTEROSCOPY Removal of IUD arm (N/A)  Patient Location: PACU  Anesthesia Type:MAC  Level of Consciousness: awake, alert  and oriented  Airway & Oxygen Therapy: Patient Spontanous Breathing  Post-op Assessment: Report given to PACU RN and Post -op Vital signs reviewed and stable  Post vital signs: Reviewed and stable  Complications: No apparent anesthesia complications

## 2013-09-07 NOTE — Interval H&P Note (Signed)
History and Physical Interval Note:  09/07/2013 7:09 AM  Karen Hendricks RusselJ Harker  has presented today for surgery, with the diagnosis of Mechanical complication due to IUD  The various methods of treatment have been discussed with the patient and family. After consideration of risks, benefits and other options for treatment, the patient has consented to  Procedure(s): HYSTEROSCOPY Removal of IUD arm (N/A) as a surgical intervention .  The patient's history has been reviewed, patient examined, no change in status, stable for surgery.  I have reviewed the patient's chart and labs.  Questions were answered to the patient's satisfaction.    Reviewed with patient, possibility of inability to remove IUD arm.     Jeanenne Licea Bovard-Stuckert

## 2013-09-07 NOTE — Discharge Instructions (Signed)

## 2013-09-07 NOTE — Brief Op Note (Signed)
09/07/2013  8:01 AM  PATIENT:  Theodis BlazeHillary J Bolle  33 y.o. female  PRE-OPERATIVE DIAGNOSIS:  Mechanical complication due to IUD  POST-OPERATIVE DIAGNOSIS:  same  PROCEDURE:  Procedure(s): HYSTEROSCOPY Removal of IUD arm (N/A)  SURGEON:  Surgeon(s) and Role:    * Ronnette Rump Bovard-Stuckert, MD - Primary  ANESTHESIA:   IV sedation/MAC and paracervical block  EBL:  Total I/O In: 800 [I.V.:800] Out: 50 [Urine:50]  BLOOD ADMINISTERED:none  DRAINS: none   LOCAL MEDICATIONS USED:  LIDOCAINE 1% and Amount: 16 ml  SPECIMEN:  No Specimen - IUD arm disposed of  DISPOSITION OF SPECIMEN:  N/A  COUNTS:  YES  TOURNIQUET:  * No tourniquets in log *  DICTATION: .Other Dictation: Dictation Number [id number]  PLAN OF CARE: Discharge to home after PACU  PATIENT DISPOSITION:  PACU - hemodynamically stable.   Delay start of Pharmacological VTE agent (>24hrs) due to surgical blood loss or risk of bleeding: not applicable

## 2013-09-07 NOTE — Op Note (Signed)
NAMSilvano Bilis:  Whiteaker, Saide                ACCOUNT NO.:  [id number]  MEDICAL RECORD NO.:  [id number]  LOCATION:  [facility]                          FACILITY:  [facility]  PHYSICIAN:  Sherron MondayJody Bovard, MD        DATE OF BIRTH:  23-Sep-1980  DATE OF PROCEDURE:  09/07/2013 DATE OF DISCHARGE:                              OPERATIVE REPORT   PREOPERATIVE DIAGNOSIS:  Retained intrauterine device arm in the cervix.  POSTOPERATIVE DIAGNOSIS:  Retained intrauterine device arm in the cervix, removed.  PROCEDURE:  Removal of retained intrauterine device.  SURGEON:  Sherron MondayJody Bovard, MD  ANESTHESIA:  IV sedation MAC with paracervical block.  IV FLUIDS:  800 mL.  URINE OUTPUT:  50 mL clear urine at the start of the procedure.  EBL:  Minimal, less than 25 mL.  COMPLICATIONS:  None.  PATHOLOGY:  None.  DESCRIPTION OF PROCEDURE:  After informed consent was reviewed with the patient and her husband, she was transported to the operating room, placed in the Yellofin stirrups.  She was prepped and draped in the normal sterile fashion.  Her bladder was sterilely drained.  Using an open-sided speculum, her cervix was easily visualized.  The anterior lip was grasped with a single-tooth tenaculum after 16 mL paracervical block had been placed.  The cervix was dilated to accommodate the diagnostic scope to approximately 25-French with Hegar dilators.  The hysteroscope was introduced in the cervix.  The IUD arm was easily visualized on the left and had been noted in the ultrasound.  The hysteroscope was removed.  Using a long Kelly, a blind sweep in the cervix was performed and the IUD was grasped and removed in its entirety.  The tenaculum was then removed from the cervix.  The speculum was removed.  The sponge, lap, and needle counts were correct x2.  The patient tolerated the procedure well.    Sherron MondayJody Bovard, MD    JB/MEDQ  D:  09/07/2013  T:  09/07/2013  Job:  [id number]

## 2013-09-08 ENCOUNTER — Encounter (HOSPITAL_COMMUNITY): Payer: Self-pay | Admitting: Obstetrics and Gynecology

## 2013-09-08 NOTE — Anesthesia Postprocedure Evaluation (Addendum)
  Anesthesia Post-op Note  Anesthesia Post Note  Patient: Karen Hendricks  Procedure(s) Performed: Procedure(s) (LRB): HYSTEROSCOPY Removal of IUD arm (N/A)  Anesthesia type: MAC  Patient location: PACU  Post pain: Pain level controlled  Post assessment: Post-op Vital signs reviewed  Last Vitals:  Filed Vitals:   09/07/13 0836  BP:   Pulse: 71  Temp: 37.1 C  Resp: 16    Post vital signs: Reviewed  Level of consciousness: sedated  Complications: No apparent anesthesia complications

## 2013-09-22 ENCOUNTER — Other Ambulatory Visit: Payer: Self-pay | Admitting: Gastroenterology

## 2013-09-22 DIAGNOSIS — R10819 Abdominal tenderness, unspecified site: Secondary | ICD-10-CM

## 2013-09-28 ENCOUNTER — Telehealth: Payer: Self-pay | Admitting: Neurology

## 2013-09-28 NOTE — Telephone Encounter (Signed)
Patient requesting refill for amphetamine-dextroamphetamine (ADDERALL XR) 20 MG 24 hr capsule.  °

## 2013-09-29 ENCOUNTER — Ambulatory Visit
Admission: RE | Admit: 2013-09-29 | Discharge: 2013-09-29 | Disposition: A | Discharge status: Home / Self Care | Payer: 59 | Source: Ambulatory Visit | Attending: Gastroenterology | Admitting: Gastroenterology

## 2013-09-29 ENCOUNTER — Other Ambulatory Visit: Payer: Self-pay | Admitting: Neurology

## 2013-09-29 DIAGNOSIS — R10819 Abdominal tenderness, unspecified site: Secondary | ICD-10-CM

## 2013-09-29 MED ORDER — AMPHETAMINE-DEXTROAMPHET ER 20 MG PO CP24: 20.0000 mg | ORAL_CAPSULE | ORAL | Status: DC

## 2013-09-29 MED ORDER — IOHEXOL 300 MG/ML  SOLN
125.0000 mL | Freq: Once | INTRAMUSCULAR | Status: AC | PRN
  Administered 2013-09-29: 125 mL via INTRAVENOUS

## 2013-09-29 NOTE — Telephone Encounter (Signed)
Pt's Rx was mailed out today. °

## 2013-11-17 ENCOUNTER — Telehealth: Payer: Self-pay | Admitting: Neurology

## 2013-11-17 NOTE — Telephone Encounter (Signed)
Patient calling to request written script of Adderall refill to be mailed to her, please call patient and advise.

## 2013-11-20 ENCOUNTER — Other Ambulatory Visit: Payer: Self-pay

## 2013-11-20 MED ORDER — AMPHETAMINE-DEXTROAMPHET ER 20 MG PO CP24: 20.0000 mg | ORAL_CAPSULE | ORAL | Status: DC

## 2013-11-20 NOTE — Telephone Encounter (Signed)
Called pt and left message informing pt that her Rx is ready to be picked up at the front desk and if she has any other problems, questions or concerns to call the office.

## 2013-11-23 NOTE — Telephone Encounter (Signed)
Noted  

## 2013-12-25 ENCOUNTER — Ambulatory Visit (INDEPENDENT_AMBULATORY_CARE_PROVIDER_SITE_OTHER): Payer: 59 | Admitting: Emergency Medicine

## 2013-12-25 VITALS — BP 128/82 | HR 104 | Temp 99.9°F | Resp 17 | Ht 68.0 in | Wt 330.0 lb

## 2013-12-25 DIAGNOSIS — M545 Low back pain, unspecified: Secondary | ICD-10-CM

## 2013-12-25 DIAGNOSIS — N3000 Acute cystitis without hematuria: Secondary | ICD-10-CM

## 2013-12-25 LAB — POCT URINALYSIS DIPSTICK
BILIRUBIN UA: NEGATIVE
Blood, UA: NEGATIVE
GLUCOSE UA: NEGATIVE
Ketones, UA: NEGATIVE
NITRITE UA: NEGATIVE
PH UA: 7
Protein, UA: NEGATIVE
Spec Grav, UA: 1.015
Urobilinogen, UA: 0.2

## 2013-12-25 LAB — POCT UA - MICROSCOPIC ONLY
CRYSTALS, UR, HPF, POC: NEGATIVE
Casts, Ur, LPF, POC: NEGATIVE
Yeast, UA: NEGATIVE

## 2013-12-25 MED ORDER — CIPROFLOXACIN HCL 500 MG PO TABS: 500.0000 mg | ORAL_TABLET | Freq: Two times a day (BID) | ORAL | Status: DC

## 2013-12-25 MED ORDER — PHENAZOPYRIDINE HCL 200 MG PO TABS: 200.0000 mg | ORAL_TABLET | Freq: Three times a day (TID) | ORAL | Status: DC | PRN

## 2013-12-25 NOTE — Patient Instructions (Signed)

## 2013-12-25 NOTE — Progress Notes (Signed)
[facility] [address] [place] [address] [phone]  Date:  12/25/2013   Name:  Karen Hendricks   DOB:  [birth year]   MRN:  [id number]  PCP:  Herb Grays, MD    Chief Complaint: Back Pain   History of Present Illness:  Karen Hendricks is a 33 y.o. very pleasant female patient who presents with the following:  Has awakened with low back pain for several days.  Has frequency and nocturia.  Change in color of urine.  No dysuria or dyspareunia or discharge. No fever or chills.  No history of overuse or injury.  Non radiating. No improvement with over the counter medications or other home remedies. Denies other complaint or health concern today.   Patient Active Problem List   Diagnosis Date Noted  . IUD mechanical complication 09/05/2013  . Hypersomnia, persistent 04/17/2013  . Encounter for medication monitoring 04/17/2013  . OSA on CPAP 04/17/2013  . OBSTRUCTIVE SLEEP APNEA 03/14/2010  . Narcolepsy 03/14/2010  . COUGH 03/12/2010    Past Medical History  Diagnosis Date  . Narcolepsy   . Hypersomnia   . OSA on CPAP   . Sinus infection   . Hypersomnia, persistent 04/17/2013    Persistent hypersomnia on CPAP, abnormal MSLT - XYREM controls hypersomnia now, remaining on CPAP.   . IUD mechanical complication 09/05/2013    Past Surgical History  Procedure Laterality Date  . Nasal sinus surgery  jan 2013  . Tonsillectomy    . Adenoid surgery    . Hysteroscopy N/A 09/07/2013    Procedure: HYSTEROSCOPY Removal of IUD arm;  Surgeon: Sherian Rein, MD;  Location: [facility];  Service: Gynecology;  Laterality: N/A;    History  Substance Use Topics  . Smoking status: Current Every Day Smoker -- 1.00 packs/day for 21 years    Types: Cigarettes  . Smokeless tobacco: Not on file  . Alcohol Use: Yes    No family history on file.  Allergies  Allergen Reactions  . Codeine Itching    REACTION: itch insomnia  . Doxycycline Swelling     Angioedema Lips swell    Medication list has been reviewed and updated.  Current Outpatient Prescriptions on File Prior to Visit  Medication Sig Dispense Refill  . amphetamine-dextroamphetamine (ADDERALL XR) 20 MG 24 hr capsule Take 1 capsule (20 mg total) by mouth every morning. Brand Medically Necessary  30 capsule  0  . ibuprofen (MOTRIN IB) 800 MG tablet Take 1 tablet (800 mg total) by mouth every 8 (eight) hours as needed for headache or moderate pain.  30 tablet  0  . polyethylene glycol powder (GLYCOLAX/MIRALAX) powder Take 17 g by mouth daily.       No current facility-administered medications on file prior to visit.    Review of Systems:  As per HPI, otherwise negative.    Physical Examination: Filed Vitals:   12/25/13 1020  BP: 128/82  Pulse: 104  Temp: 99.9 F (37.7 C)  Resp: 17   Filed Vitals:   12/25/13 1020  Height:  (1.727 m)  Weight: 330 lb (149.687 kg)   Body mass index is 50.19 kg/(m^2). Ideal Body Weight: Weight in (lb) to have BMI = 25: 164.1   GEN: morbidly obese, NAD, Non-toxic, Alert & Oriented x 3 HEENT: Atraumatic, Normocephalic.  Ears and Nose: No external deformity. EXTR: No clubbing/cyanosis/edema NEURO: Normal gait.  PSYCH: Normally interactive. Conversant. Not depressed or anxious appearing.  Calm demeanor.  Assessment and Plan: Cystitis cipro Pyridium  Signed,  Phillips Odor, MD   Results for orders placed in visit on 12/25/13  POCT URINALYSIS DIPSTICK      Result Value Ref Range   Color, UA yellow     Clarity, UA hazy     Glucose, UA neg     Bilirubin, UA neg     Ketones, UA neg     Spec Grav, UA 1.015     Blood, UA neg     pH, UA 7.0     Protein, UA neg     Urobilinogen, UA 0.2     Nitrite, UA neg     Leukocytes, UA small (1+)    POCT UA - MICROSCOPIC ONLY      Result Value Ref Range   WBC, Ur, HPF, POC 5-10     RBC, urine, microscopic 0-2     Bacteria, U Microscopic 1+     Mucus, UA trace      Epithelial cells, urine per micros 4-8     Crystals, Ur, HPF, POC neg     Casts, Ur, LPF, POC neg     Yeast, UA neg

## 2014-01-08 ENCOUNTER — Telehealth: Payer: Self-pay | Admitting: Neurology

## 2014-01-08 NOTE — Telephone Encounter (Signed)
Patient requesting refill of Adderall script, please call when ready for pick up.  °

## 2014-01-11 ENCOUNTER — Other Ambulatory Visit: Payer: Self-pay | Admitting: Neurology

## 2014-01-11 MED ORDER — AMPHETAMINE-DEXTROAMPHET ER 20 MG PO CP24: 20.0000 mg | ORAL_CAPSULE | ORAL | Status: DC

## 2014-01-11 NOTE — Telephone Encounter (Signed)
Pt's Rx was given to Dr. Dohmeier for signature. °

## 2014-01-12 NOTE — Telephone Encounter (Signed)
Called pt and left message informing her that her Rx was ready to be picked up at the front desk and if she has any other problems, questions or concerns to call the office.  °

## 2014-03-01 ENCOUNTER — Other Ambulatory Visit: Payer: Self-pay | Admitting: Neurology

## 2014-03-01 MED ORDER — AMPHETAMINE-DEXTROAMPHET ER 20 MG PO CP24: 20.0000 mg | ORAL_CAPSULE | ORAL | Status: DC

## 2014-03-01 NOTE — Telephone Encounter (Signed)
Request entered, forwarded to provider for approval.  

## 2014-03-01 NOTE — Telephone Encounter (Signed)
Patient requesting Rx refill for amphetamine-dextroamphetamine (ADDERALL XR) 20 MG 24 hr capsule.  Please call when ready for pick up. °

## 2014-03-05 NOTE — Telephone Encounter (Signed)
I called the patient to let them know their Rx for Adderall was ready for pickup. Patient was instructed to bring Photo ID. 

## 2014-03-20 DIAGNOSIS — R635 Abnormal weight gain: Secondary | ICD-10-CM

## 2014-03-20 HISTORY — DX: Abnormal weight gain: R63.5

## 2014-04-16 ENCOUNTER — Telehealth: Payer: Self-pay | Admitting: Neurology

## 2014-04-18 ENCOUNTER — Encounter: Payer: Self-pay | Admitting: Neurology

## 2014-04-18 ENCOUNTER — Ambulatory Visit: Payer: 59 | Admitting: Neurology

## 2014-04-18 ENCOUNTER — Ambulatory Visit (INDEPENDENT_AMBULATORY_CARE_PROVIDER_SITE_OTHER): Payer: 59 | Admitting: Neurology

## 2014-04-18 VITALS — BP 131/92 | HR 100 | Resp 14 | Ht 68.0 in | Wt 338.5 lb

## 2014-04-18 DIAGNOSIS — Z9989 Dependence on other enabling machines and devices: Secondary | ICD-10-CM

## 2014-04-18 DIAGNOSIS — G4710 Hypersomnia, unspecified: Secondary | ICD-10-CM

## 2014-04-18 DIAGNOSIS — G47419 Narcolepsy without cataplexy: Secondary | ICD-10-CM

## 2014-04-18 DIAGNOSIS — G4733 Obstructive sleep apnea (adult) (pediatric): Secondary | ICD-10-CM

## 2014-04-18 DIAGNOSIS — G4730 Sleep apnea, unspecified: Secondary | ICD-10-CM

## 2014-04-18 MED ORDER — MELATONIN 10 MG PO CAPS: ORAL_CAPSULE | ORAL | Status: AC

## 2014-04-18 MED ORDER — AMPHETAMINE-DEXTROAMPHET ER 20 MG PO CP24: 20.0000 mg | ORAL_CAPSULE | ORAL | Status: DC

## 2014-04-18 NOTE — Progress Notes (Signed)
[facility]  Provider:  Melvyn Novas, M D  Referring Provider: Herb Grays, MD Primary Care Physician:  Karen Grays, MD  Chief Complaint  Patient presents with  . RV sleep cpap    Rm 10, alone    HPI:  Karen Hendricks is a 33 y.o. female , seen here as a revisit  from Karen Hendricks  for CPAP yearly compliance visit :  I have followed this patient since 2012 for a sleep problem. The patient was diagnosed with obstructive sleep apnea in 2010 at the time she was titrated to 11 cm a diagnostic polysomnography performed on 06-07-08 documented an AHI of 8.3 but an associated oxygen nadir of 69%. At the time Coble might dioxide was not measured , but we  assumed strongly that there was a component of  obesity hypoventilation.  The patient also complained of insomnia at the time -she was using her CPAP machine compliantly over the last 4 years and since her excessive daytime sleepiness did not improve with CPAP alone was followed by MS LT study on 10-26-2008 ,which showed an amazingly short mean sleep latency of 2.9 minutes, no sleep onset REM periods-  this followed a PSG -Night of  normal polysomnography on CPAP. Pressure of 11 cm water.    Review of Systems: Out of a complete 14 system review, the patient complains of only the following symptoms, and all other reviewed systems are negative.  Patient endorsed constipation low-grade fevers at night frequent awakening daytime sleepiness which still persists in spite of CPAP he was and in spite of Xyrem he was for the treatment of narcolepsy and excessive daytime sleepiness. The patient is at the upper use or alcohol use also rarely she denies any recreational drug use. She had no intermittent family medical history or personal change in medical history the patient is already on Adderall XR Xyrem and CPAP.   2014  Epworth sleepiness score was endorsed at 9 points and the fatigue severity score of 55 points the sleep study and MS LT  results were attached to today's paperwork . Refill of  for Xyrem today for the patient.  BMI is still 48 but reduced in comparison to last year's 50.1.  04-18-14 [name] has stopped using Xyrem because she was noticed sleepwalking but sleeps smoking with the associated high danger of setting her house on fire. She endorsed today an Epworth sleepiness score of 8 points and a fatigue severity score 59 points.  Her download for her CPAP machine shows a pressure setting of 11 cm water with 3 cm EPR a moderate air leak a residual AHI of 4.8 and an average daily use at time of 8 hours 16 minutes.    CPAP Compliance is 100% for  days of use as well as for 4 hours or more of daily user time. She continues on Adderall.    History   Social History  . Marital Status: Married    Spouse Name: Karen Hendricks    Number of Children: 1  . Years of Education: 12   Occupational History  . dental hygienist    Social History Main Topics  . Smoking status: Current Every Day Smoker -- 1.00 packs/day for 21 years    Types: Cigarettes  . Smokeless tobacco: Not on file  . Alcohol Use: 0.0 oz/week    0 Not specified per week     Comment: rare  . Drug Use: No  . Sexual Activity: Not on file  Other Topics Concern  . Not on file   Social History Narrative    History reviewed. No pertinent family history.  Past Medical History  Diagnosis Date  . Narcolepsy   . Hypersomnia   . OSA on CPAP   . Sinus infection   . Hypersomnia, persistent 04/17/2013    Persistent hypersomnia on CPAP, abnormal MSLT - XYREM controls hypersomnia now, remaining on CPAP.   . IUD mechanical complication 09/05/2013  . Weight gain 03/2014    Cushing's??  Work up    Past Surgical History  Procedure Laterality Date  . Nasal sinus surgery  jan 2013  . Tonsillectomy    . Adenoid surgery    . Hysteroscopy N/A 09/07/2013    Procedure: HYSTEROSCOPY Removal of IUD arm;  Surgeon: Karen ReinJody Bovard-Stuckert, MD;  Location: [facility];  Service:  Gynecology;  Laterality: N/A;    Current Outpatient Prescriptions  Medication Sig Dispense Refill  . amphetamine-dextroamphetamine (ADDERALL XR) 20 MG 24 hr capsule Take 1 capsule (20 mg total) by mouth every morning. Brand Medically Necessary 30 capsule 0  . polyethylene glycol powder (GLYCOLAX/MIRALAX) powder Take 17 g by mouth daily.     No current facility-administered medications for this visit.    Allergies as of 04/18/2014 - Review Complete 04/18/2014  Allergen Reaction Noted  . Codeine Itching   . Doxycycline Swelling 12/16/2012    Vitals: BP 131/92 mmHg  Pulse 100  Resp 14  Ht 5\' 8"  (1.727 m)  Wt 338 lb 8 oz (153.543 kg)  BMI 51.48 kg/m2 Last Weight:  Wt Readings from Last 1 Encounters:  04/18/14 338 lb 8 oz (153.543 kg)   Last Height:   Ht Readings from Last 1 Encounters:  04/18/14 5\' 8"  (1.727 m)    Physical exam: weight increased to above 50 BMI.   General: The patient is awake, alert and appears not in acute distress. The patient is well groomed. Head: Normocephalic, atraumatic. Neck is supple. Mallampati 3 , neck circumference:17, 5  no TMJ  Cardiovascular:  Regular rate and rhythm, fast , without  murmurs or carotid bruit, and without distended neck veins. Respiratory: Lungs are clear to auscultation. Skin:  Without evidence of edema, or rash Trunk: BMI is  elevated and patient  has normal posture.  Neurologic exam : The patient is awake and alert, oriented to place and time. Memory subjective described as intact. There is a normal attention span & concentration ability. Speech is fluent without dysarthria, dysphonia or aphasia. Mood and affect are appropriate.  Cranial nerves: Pupils are equal and briskly reactive to light. Extraocular movements  in vertical and horizontal planes intact and without nystagmus. Visual fields by finger perimetry are intact. Hearing to finger rub intact. Facial motor strength is symmetric and tongue and uvula move  midline. Gait and station: Patient walks without assistive device . Deep tendon reflexes: in the upper and lower extremities are symmetric and intact. Babinski down going.   Assessment:   After physical and neurologic examination, review of laboratory studies, imaging, neurophysiology testing and pre-existing records, assessment is : Narcolepsy. Based on clinical symptoms  (and hypersomnia degree )during MSLT.   Plan:   OSA on CPAP compliance record only needed once yearly. This years report was of  100% compliance.   Epworth is 7 points on Adderall. Xyrem is discontinued.  [facility] is DME. Percell BostonXYREM discontinued, Labs to be drawn by PCP.  The patient has been hypersomnic since age 33, and suspected to have Cushing's disease -  Sees endocrinology.

## 2014-04-30 ENCOUNTER — Telehealth: Payer: Self-pay | Admitting: *Deleted

## 2014-04-30 NOTE — Telephone Encounter (Signed)
LMVM for pt on her cell # that HLA narcolepsy test result normal.

## 2014-06-07 ENCOUNTER — Other Ambulatory Visit: Payer: Self-pay | Admitting: Neurology

## 2014-06-07 DIAGNOSIS — G47419 Narcolepsy without cataplexy: Secondary | ICD-10-CM

## 2014-06-07 MED ORDER — AMPHETAMINE-DEXTROAMPHET ER 20 MG PO CP24: 20.0000 mg | ORAL_CAPSULE | ORAL | Status: DC

## 2014-06-07 NOTE — Telephone Encounter (Signed)
Request entered, forwarded to provider for approval.  

## 2014-06-07 NOTE — Telephone Encounter (Signed)
Patient is calling to get a written Rx for Adderal. Please call patient when ready for pickup. Thank you. °

## 2014-06-08 ENCOUNTER — Telehealth: Payer: Self-pay

## 2014-06-08 NOTE — Telephone Encounter (Signed)
Called patient to inform Rx ready for pick up at front desk. No answer. Left vmail. 

## 2014-08-29 ENCOUNTER — Other Ambulatory Visit: Payer: Self-pay | Admitting: Neurology

## 2014-08-29 DIAGNOSIS — G47419 Narcolepsy without cataplexy: Secondary | ICD-10-CM

## 2014-08-29 NOTE — Telephone Encounter (Signed)
Patient called and requested a refill on Rx. amphetamine-dextroamphetamine (ADDERALL XR) 20 MG 24 hr capsule. Please call and advise.  °

## 2014-08-29 NOTE — Telephone Encounter (Signed)
Request entered, forwarded to provider for approval.  

## 2014-08-30 ENCOUNTER — Telehealth: Payer: Self-pay

## 2014-08-30 MED ORDER — AMPHETAMINE-DEXTROAMPHET ER 20 MG PO CP24: 20.0000 mg | ORAL_CAPSULE | ORAL | Status: DC

## 2014-08-30 NOTE — Telephone Encounter (Signed)
Called pt to inform her that her rx for adderall is available for pick up at the front desk. Gave her clinic hours. Left message on her home phone per DPR.

## 2014-10-25 ENCOUNTER — Other Ambulatory Visit: Payer: Self-pay | Admitting: Neurology

## 2014-10-25 ENCOUNTER — Encounter: Payer: Self-pay | Admitting: *Deleted

## 2014-10-25 DIAGNOSIS — G47419 Narcolepsy without cataplexy: Secondary | ICD-10-CM

## 2014-10-25 MED ORDER — AMPHETAMINE-DEXTROAMPHET ER 20 MG PO CP24: 20.0000 mg | ORAL_CAPSULE | ORAL | Status: DC

## 2014-10-25 NOTE — Telephone Encounter (Signed)
Dr Dohmeier is out of the office.  Request entered, forwarded to WID for review.  

## 2014-10-25 NOTE — Progress Notes (Signed)
Prescription placed at front office for patient pick up.

## 2014-10-25 NOTE — Telephone Encounter (Signed)
Patient called requesting a refill for  amphetamine-dextroamphetamine (ADDERALL XR) 20 MG 24 hr capsule . Patient advised RX will be ready within 24 hours unless otherwise informed by RN. Patient can be reached at [phone].

## 2014-12-07 ENCOUNTER — Other Ambulatory Visit: Payer: Self-pay | Admitting: Neurology

## 2014-12-07 ENCOUNTER — Telehealth: Payer: Self-pay

## 2014-12-07 DIAGNOSIS — G47419 Narcolepsy without cataplexy: Secondary | ICD-10-CM

## 2014-12-07 MED ORDER — AMPHETAMINE-DEXTROAMPHET ER 20 MG PO CP24: 20.0000 mg | ORAL_CAPSULE | ORAL | Status: DC

## 2014-12-07 NOTE — Telephone Encounter (Signed)
Dr Dohmeier is out of the office.  Forwarding request to WID for review.   

## 2014-12-07 NOTE — Telephone Encounter (Signed)
Patient called requesting refill for amphetamine-dextroamphetamine (ADDERALL XR) 20 MG 24 hr capsule.Pt advised RX will be ready within 24 hours unless otherwise informed by RN

## 2014-12-07 NOTE — Telephone Encounter (Signed)
Rx ready for pick up. 

## 2015-01-24 ENCOUNTER — Other Ambulatory Visit: Payer: Self-pay | Admitting: Neurology

## 2015-01-24 DIAGNOSIS — G47419 Narcolepsy without cataplexy: Secondary | ICD-10-CM

## 2015-01-24 MED ORDER — AMPHETAMINE-DEXTROAMPHET ER 20 MG PO CP24: 20.0000 mg | ORAL_CAPSULE | ORAL | Status: DC

## 2015-01-24 NOTE — Telephone Encounter (Signed)
Dr Dohmeier is out of the office.  Request entered, forwarded to WID for review.  

## 2015-01-24 NOTE — Progress Notes (Unsigned)
Pt's RX is ready for pick up at the front desk. 

## 2015-01-24 NOTE — Progress Notes (Unsigned)
Subjective:    Patient ID: Karen Hendricks is a 34 y.o. female.  HPI {Common ambulatory SmartLinks:[id number]}  Review of Systems  Objective:  Neurologic Exam  Physical Exam  Assessment:   ***  Plan:   ***

## 2015-01-24 NOTE — Telephone Encounter (Signed)
Pt needs refill on amphetamine-dextroamphetamine (ADDERALL XR) 20 MG 24 hr capsule. Thank you °

## 2015-02-14 NOTE — Telephone Encounter (Signed)
Error

## 2015-03-12 ENCOUNTER — Other Ambulatory Visit: Payer: Self-pay | Admitting: Neurology

## 2015-03-12 ENCOUNTER — Telehealth: Payer: Self-pay

## 2015-03-12 DIAGNOSIS — G47419 Narcolepsy without cataplexy: Secondary | ICD-10-CM

## 2015-03-12 MED ORDER — AMPHETAMINE-DEXTROAMPHET ER 20 MG PO CP24: 20.0000 mg | ORAL_CAPSULE | ORAL | Status: DC

## 2015-03-12 NOTE — Telephone Encounter (Signed)
Request entered, forwarded to provider for review.   Has annual appt scheduled in Dec

## 2015-03-12 NOTE — Telephone Encounter (Signed)
Patient is calling to order a written Rx amphetamine-dextroamphetamine 20 mg 24 hr. Capsule.  Thanks!

## 2015-03-12 NOTE — Telephone Encounter (Signed)
Called pt to advise her that her adderall RX is ready for pick up at the front desk. Left a message on her mobile number per DPR.

## 2015-04-16 ENCOUNTER — Ambulatory Visit (INDEPENDENT_AMBULATORY_CARE_PROVIDER_SITE_OTHER): Payer: 59 | Admitting: Neurology

## 2015-04-16 ENCOUNTER — Encounter: Payer: Self-pay | Admitting: Neurology

## 2015-04-16 VITALS — BP 146/90 | HR 92 | Resp 20 | Ht 67.0 in | Wt 338.0 lb

## 2015-04-16 DIAGNOSIS — G4733 Obstructive sleep apnea (adult) (pediatric): Secondary | ICD-10-CM | POA: Diagnosis not present

## 2015-04-16 DIAGNOSIS — G4712 Idiopathic hypersomnia without long sleep time: Secondary | ICD-10-CM

## 2015-04-16 MED ORDER — SUVOREXANT 15 MG PO TABS: 15.0000 mg | ORAL_TABLET | Freq: Every evening | ORAL | Status: DC

## 2015-04-16 NOTE — Progress Notes (Signed)
[facility]  Provider:  Melvyn Novas, M D  Referring Provider: Herb Grays, MD Primary Care Physician:  Herb Grays, MD  Chief Complaint  Patient presents with  . Follow-up    cpap, going well, unable to download cpap, our office does not have the software to download it any longer, rm 10, alone    HPI:  Karen Hendricks is a 34 y.o. female , seen here as a revisit  from Dr. Collins [place]  for CPAP yearly compliance visit :  I have followed this patient since 2012 for a sleep problem. The patient was diagnosed with obstructive sleep apnea in 2010 at the time she was titrated to 11 cm a diagnostic polysomnography performed on 06-07-08 documented an AHI of 8.3 but an associated oxygen nadir of 69%. At the time Coble might dioxide was not measured , but we  assumed strongly that there was a component of  obesity hypoventilation.  The patient also complained of insomnia at the time -she was using her CPAP machine compliantly over the last 4 years and since her excessive daytime sleepiness did not improve with CPAP alone was followed by MS LT study on 10-26-2008 ,which showed an amazingly short mean sleep latency of 2.9 minutes, no sleep onset REM periods-  this followed a PSG -Night of  normal polysomnography on CPAP. Pressure of 11 cm water.  27th of December 2016, revisit yearly for Karen Hendricks. The patient is here for her yearly revisit. He continues to use CPAP, is highly compliant. She continues to smoke a moderate amount about 5-10 cigarettes a day, she has the same sleep habits that she has reported in the past. Karen Hendricks further reports that she still has trouble sleeping through the night. This has been the problem even when she took Xyrem. She can control her sleepiness to some degree on Adderall. She uses especially on work days. I wanted to offer her Ambien for only 2-3  Days week. She was diagnosed with ideopathic hypersomia. I I introduced the idea of Belsomra , and Glucophage  have some samples or at least a coupon to see that she can try it without costs.     Review of Systems: Out of a complete 14 system review, the patient complains of only the following symptoms, and all other reviewed systems are negative.  Patient endorsed constipation low-grade fevers at night frequent awakening daytime sleepiness which still persists in spite of CPAP he was and in spite of Xyrem he was for the treatment of narcolepsy and excessive daytime sleepiness. The patient is at the upper use or alcohol use also rarely she denies any recreational drug use. She had no intermittent family medical history or personal change in medical history the patient is already on Adderall XR Xyrem and CPAP.   2014  Epworth sleepiness score was endorsed at 9 points and the fatigue severity score of 55 points the sleep study and MS LT results were attached to today's paperwork . Refill of  for Xyrem today for the patient.  BMI is still 48 but reduced in comparison to last year's 50.1.  04-18-14 Karen Hendricks has stopped using Xyrem because she was noticed sleepwalking - sleept smoking with the associated high danger of setting her house on fire.  She endorsed today an Epworth sleepiness score of 8 points and a fatigue severity score 59 points.  Her download for her CPAP machine shows a pressure setting of 11 cm water with 3 cm EPR a  moderate air leak a residual AHI of 4.8 and an average daily use at time of 8 hours 16 minutes.    CPAP Compliance is 100% for  days of use as well as for 4 hours or more of daily user time. She continues on Adderall.    Social History   Social History  . Marital Status: Married    Spouse Name: Jonny Ruizjohn  . Number of Children: 1  . Years of Education: 12   Occupational History  . dental hygienist    Social History Main Topics  . Smoking status: Current Every Day Smoker -- 1.00 packs/day for 21 years    Types: Cigarettes  . Smokeless tobacco: Not on file  . Alcohol Use:  0.0 oz/week    0 Standard drinks or equivalent per week     Comment: rare  . Drug Use: No  . Sexual Activity: Not on file   Other Topics Concern  . Not on file   Social History Narrative    Family History  Problem Relation Age of Onset  . Hypertension Mother   . Psoriasis Father     Past Medical History  Diagnosis Date  . Narcolepsy   . Hypersomnia   . OSA on CPAP   . Sinus infection   . Hypersomnia, persistent 04/17/2013    Persistent hypersomnia on CPAP, abnormal MSLT - XYREM controls hypersomnia now, remaining on CPAP.   . IUD mechanical complication 09/05/2013  . Weight gain 03/2014    Cushing's??  Work up  . Hidradenitis suppurativa     Past Surgical History  Procedure Laterality Date  . Nasal sinus surgery  jan 2013  . Tonsillectomy    . Adenoid surgery    . Hysteroscopy N/A 09/07/2013    Procedure: HYSTEROSCOPY Removal of IUD arm;  Surgeon: Sherian ReinJody Bovard-Stuckert, MD;  Location: [facility];  Service: Gynecology;  Laterality: N/A;    Current Outpatient Prescriptions  Medication Sig Dispense Refill  . amphetamine-dextroamphetamine (ADDERALL XR) 20 MG 24 hr capsule Take 1 capsule (20 mg total) by mouth every morning. Brand Medically Necessary 30 capsule 0  . clindamycin (CLEOCIN T) 1 % lotion Apply topically 2 (two) times daily.    . Melatonin 10 MG CAPS 5 to 10 mg one hour before intended bedtime. (Patient taking differently: 10 mg. 5 to 10 mg one hour before intended bedtime.) 30 capsule 0  . metFORMIN (GLUCOPHAGE-XR) 500 MG 24 hr tablet 1,000 mg at bedtime and may repeat dose one time if needed.  2  . polyethylene glycol powder (GLYCOLAX/MIRALAX) powder Take 17 g by mouth every other day.      No current facility-administered medications for this visit.    Allergies as of 04/16/2015 - Review Complete 04/16/2015  Allergen Reaction Noted  . Codeine Itching   . Doxycycline Swelling 12/16/2012    Vitals: BP 146/90 mmHg  Pulse 92  Resp 20  Ht 5\' 7"  (1.702 m)   Wt 338 lb (153.316 kg)  BMI 52.93 kg/m2 Last Weight:  Wt Readings from Last 1 Encounters:  04/16/15 338 lb (153.316 kg)   Last Height:   Ht Readings from Last 1 Encounters:  04/16/15 5\' 7"  (1.702 m)    Physical exam: weight increased to above 50 BMI.   General: The patient is awake, alert and appears not in acute distress. The patient is well groomed. Head: Normocephalic, atraumatic. Neck is supple. Mallampati 3 , neck circumference:17, 5  no TMJ  Cardiovascular:  Regular rate and rhythm, fast ,  without  murmurs or carotid bruit, and without distended neck veins. Respiratory: Lungs are clear to auscultation. Skin:  Without evidence of edema, or rash Trunk: BMI is  elevated and patient  has normal posture.  Neurologic exam : The patient is awake and alert, oriented to place and time. Memory subjective described as intact. There is a normal attention span & concentration ability. Speech is fluent without dysarthria, dysphonia or aphasia. Mood and affect are appropriate.  Cranial nerves: Pupils are equal and briskly reactive to light. Extraocular movements  in vertical and horizontal planes intact and without nystagmus. Visual fields by finger perimetry are intact. Hearing to finger rub intact. Facial motor strength is symmetric and tongue and uvula move midline.  Assessment:   After physical and neurologic examination, review of laboratory studies, imaging, neurophysiology testing and pre-existing records, assessment is : Ideopathic hypersomnia.  Based on clinical symptoms  (and hypersomnia degree ) during MSLT.  Plan:   OSA on CPAP compliance record only needed once yearly.  Epworth is 11 points on Adderall. Xyrem was discontinued in 2014 . Her fatigue severity score was 61 which remains very high. [facility] remains her  DME.  XYREM discontinued, Labs to be drawn by PCP. 99.2 fahenheit every afternoon- unknown cause.  Cytomegaly and Malachi Carl virus exposure was confirmed , but no  active disease.  The patient has been suffering from  hypersomnia since age 72, and was suspected to have Cushing's disease - Dr. at [facility] ruled this condition out.     RV in 12 month.  CC; Dewain Penning, MD

## 2015-04-16 NOTE — Patient Instructions (Signed)
Suvorexant oral tablets What is this medicine? SUVOREXANT (su-vor-EX-ant) is used to treat insomnia. This medicine helps you to fall asleep and sleep through the night. This medicine may be used for other purposes; ask your health care provider or pharmacist if you have questions. What should I tell my health care provider before I take this medicine? They need to know if you have any of these conditions: -depression -history of a drug or alcohol abuse problem -history of daytime sleepiness -history of sudden onset of muscle weakness (cataplexy) -liver disease -lung or breathing disease -narcolepsy -suicidal thoughts, plans, or attempt; a previous suicide attempt by you or a family member -an unusual or allergic reaction to suvorexant, other medicines, foods, dyes, or preservatives -pregnant or trying to get pregnant -breast-feeding How should I use this medicine? Take this medicine by mouth within 30 minutes of going to bed. Do not take it unless you are able to stay in bed a full night before you must be active again. Follow the directions on the prescription label. For best results, it is better to take this medicine on an empty stomach. Do not take your medicine more often than directed. Do not stop taking this medicine on your own. Always follow your doctor or health care professional's advice. A special MedGuide will be given to you by the pharmacist with each prescription and refill. Be sure to read this information carefully each time. Talk to your pediatrician regarding the use of this medicine in children. Special care may be needed. Overdosage: If you think you have taken too much of this medicine contact a poison control center or emergency room at once. NOTE: This medicine is only for you. Do not share this medicine with others. What if I miss a dose? This medicine should only be taken immediately before going to sleep. Do not take double or extra doses. What may interact with  this medicine? -alcohol -antiviral medicines for HIV or AIDS -aprepitant -carbamazepine -certain antibiotics like ciprofloxacin, clarithromycin, erythromycin, telithromycin -certain medicines for depression or psychotic disturbances -certain medicines for fungal infections like ketoconazole, posaconazole, fluconazole, or itraconazole -conivaptan -digoxin -diltiazem -grapefruit juice -imatinib -medicines for anxiety or sleep -phenytoin -rifampin -verapamil This list may not describe all possible interactions. Give your health care provider a list of all the medicines, herbs, non-prescription drugs, or dietary supplements you use. Also tell them if you smoke, drink alcohol, or use illegal drugs. Some items may interact with your medicine. What should I watch for while using this medicine? Visit your doctor or health care professional for regular checks on your progress. Keep a regular sleep schedule by going to bed at about the same time each night. Avoid caffeine-containing drinks in the evening hours. When sleep medicines are used every night for more than a few weeks, they may stop working. Do not increase the dose on your own. Talk to your doctor if your insomnia worsens or is not better within 7 to 10 days. After taking this medicine for sleep, you may get up out of bed while not being fully awake and do an activity that you do not know you are doing. The next morning, you may have no memory of the event. Activities such as driving a car ("sleep-driving"), making and eating food, talking on the phone, sexual activity, and sleep-walking have been reported. Call your doctor right away if you find out you have done any of these activities. Do not take this medicine if you have used alcohol that evening   or before bed or taken another medicine for sleep, since your risk of doing these sleep-related activities will be increased. Do not take this medicine unless you are able to stay in bed for a  full night (7 to 8 hours) and do not drive or perform other activities requiring full alertness within 8 hours of a dose. Do not drive, use machinery, or do anything that needs mental alertness the day after you take the 20 mg dose of this medicine. The use of lower doses (10 mg) also has the potential to cause driving impairment the next day. You may have a decrease in mental alertness the day after use, even if you feel that you are fully awake. Tell your doctor if you will need to perform activities requiring full alertness, such as driving, the next day. Do not stand or sit up quickly after taking this medicine, especially if you are an older patient. This reduces the risk of dizzy or fainting spells. If you or your family notice any changes in your behavior, such as new or worsening depression, thoughts of harming yourself, anxiety, other unusual or disturbing thoughts, or memory loss, call your doctor right away. What side effects may I notice from receiving this medicine? Side effects that you should report to your doctor or health care professional as soon as possible: -allergic reactions like skin rash, itching or hives, swelling of the face, lips, or tongue -confusion -depressed mood -feeling faint or lightheaded, falls -hallucinations -inability to move or speak for up to several minutes while you are going to sleep or waking up -memory loss -periods of leg weakness lasting from seconds to a few minutes -problems with balance, speaking, walking -restlessness, excitability, or feelings of agitation -unusual activities while asleep like driving, eating, making phone calls Side effects that usually do not require medical attention (Report these to your doctor or health care professional if they continue or are bothersome.): -abnormal dreams -daytime drowsiness -diarrhea -dizziness -headache This list may not describe all possible side effects. Call your doctor for medical advice about  side effects. You may report side effects to FDA at 1-800-FDA-1088. Where should I keep my medicine? Keep out of the reach of children. This medicine can be abused. Keep your medicine in a safe place to protect it from theft. Do not share this medicine with anyone. Selling or giving away this medicine is dangerous and against the law. Store at room temperature between 15 and 30 degrees C (59 and 86 degrees F). Throw away any unused medicine after the expiration date. NOTE: This sheet is a summary. It may not cover all possible information. If you have questions about this medicine, talk to your doctor, pharmacist, or health care provider.    2016, Elsevier/Gold Standard. (2014-09-24 13:22:51)  

## 2015-04-18 ENCOUNTER — Ambulatory Visit: Payer: 59 | Admitting: Neurology

## 2015-04-24 ENCOUNTER — Other Ambulatory Visit: Payer: Self-pay | Admitting: Neurology

## 2015-04-24 ENCOUNTER — Telehealth: Payer: Self-pay | Admitting: Neurology

## 2015-04-24 DIAGNOSIS — G4733 Obstructive sleep apnea (adult) (pediatric): Secondary | ICD-10-CM

## 2015-04-24 NOTE — Telephone Encounter (Signed)
Patient called requesting to speak with Dr. Vickey Hugerohmeier about Suvorexant [facility](BELSOMRA) 15 MG TABS, states the fax coming Friday will have 30 day download and last 10 days will include nights with BELSOMRA and patient can't tell if she is sleeping better with BELSOMRA or not, does notice that she is dreaming where normally she doesn't, also states [facility], states nurse gave coupon for 10 days worth of BELSOMRA free, she used the coupon for those 10 days. Patient is aware Dr. Vickey Hugerohmeier is out of the office today, would like to speak with Dr. Vickey Hugerohmeier tomorrow when she is back in the office.

## 2015-04-25 ENCOUNTER — Other Ambulatory Visit: Payer: Self-pay

## 2015-04-25 MED ORDER — SUVOREXANT 15 MG PO TABS: 15.0000 mg | ORAL_TABLET | Freq: Every evening | ORAL | Status: DC

## 2015-04-25 NOTE — Telephone Encounter (Signed)
Spoke to pt. While she has taken the belsomra, she reports that she dreams, and she does not usually dream, so she thinks she is getting deeper sleep. She reports that she still wakes up a lot at night still. [facility] a download. She wants Dr. Vickey Hugerohmeier to look at it and see if she can tell if her sleep is better (longer) or not. I advised her that it will probably be Monday before we are able to get back to her. Pt verbalized understanding.

## 2015-04-25 NOTE — Telephone Encounter (Signed)
Returned pt's call. No answer. Left a message asking her to call me back.

## 2015-04-26 NOTE — Telephone Encounter (Signed)
Patient called to leave fax number [phone] [facility], also wants Baxter HireKristen to know she dropped off 30 day [facility]. Is aware Baxter HireKristen is out of the office today and will return on Monday 04/29/15.

## 2015-04-30 NOTE — Telephone Encounter (Signed)
Called pt to discuss download. Dr. Vickey Hugerohmeier looked at it and can see that in the last half of the 30 days, pt was able to sleep longer. Asked pt to call us back.

## 2015-05-01 NOTE — Telephone Encounter (Signed)
Dr. Vickey Hugerohmeier said it was ok to order a new cpap for pt. She prefers a dream station and wants it set at 11cm H2O with 3 cm EPR. [facility]  Called pt again to discuss, no answer, left a message asking her to call me back.

## 2015-05-01 NOTE — Telephone Encounter (Signed)
Spoke to pt. I advised her that Dr. Vickey Hugerohmeier reviewed her cpap download and has noted an improvement in length of usage of cpap. I also advised her that Dr. Vickey Hugerohmeier did order a new cpap for the pt and [facility]. Pt verbalized understanding and gratitude.

## 2015-05-01 NOTE — Telephone Encounter (Signed)
Pt returned call. She said she will have access to her phone until 1:45 today and to pls leave a detailed VM or she can call back tomorrow.

## 2015-05-08 ENCOUNTER — Other Ambulatory Visit: Payer: Self-pay | Admitting: Sports Medicine

## 2015-05-08 ENCOUNTER — Telehealth: Payer: Self-pay | Admitting: Neurology

## 2015-05-08 DIAGNOSIS — S92215A Nondisplaced fracture of cuboid bone of left foot, initial encounter for closed fracture: Secondary | ICD-10-CM

## 2015-05-08 NOTE — Telephone Encounter (Signed)
Patient called to check status of Prior Auth for Suvorexant (BELSOMRA) 15 MG TABS

## 2015-05-08 NOTE — Telephone Encounter (Signed)
Ins has been contacted and provided with clinical info.  Request is under review Ref # [id number]  [facility]) has approved the request for coverage on Belsomra effective until 05/07/2016 Ref # [id number].  Ins has contacted the patient, and we have notified the pharmacy.

## 2015-05-10 ENCOUNTER — Ambulatory Visit
Admission: RE | Admit: 2015-05-10 | Discharge: 2015-05-10 | Disposition: A | Discharge status: Home / Self Care | Payer: 59 | Source: Ambulatory Visit | Attending: Sports Medicine | Admitting: Sports Medicine

## 2015-05-10 DIAGNOSIS — S92215A Nondisplaced fracture of cuboid bone of left foot, initial encounter for closed fracture: Secondary | ICD-10-CM

## 2015-05-13 ENCOUNTER — Other Ambulatory Visit: Payer: Self-pay

## 2015-05-13 ENCOUNTER — Telehealth: Payer: Self-pay | Admitting: Neurology

## 2015-05-13 NOTE — Telephone Encounter (Signed)
Patient called to advise BELSOMRA was [facility] into [facility] [address] for Quantity of 10 with 5 refills, request that Dr. Vickey Huger send in for 30 vs 10.

## 2015-05-13 NOTE — Telephone Encounter (Signed)
Patient is calling back and states that [facility] admitted that [facility] was written correctly and that they made a mistake filling it.  She has the correct quanity now.  Thanks!

## 2015-05-17 NOTE — Telephone Encounter (Signed)
Pt called to check status of PA. Msg from [place] dated 05/13/15 relayed to pt. [facility]

## 2015-06-06 ENCOUNTER — Other Ambulatory Visit: Payer: Self-pay

## 2015-06-06 ENCOUNTER — Telehealth: Payer: Self-pay | Admitting: Neurology

## 2015-06-06 DIAGNOSIS — G47419 Narcolepsy without cataplexy: Secondary | ICD-10-CM

## 2015-06-06 NOTE — Telephone Encounter (Signed)
This encounter was already closed when it was forwarded.  Rx request sent to provider in separate Refill encounter.

## 2015-06-06 NOTE — Telephone Encounter (Signed)
Pt needs re-fill on amphetamine-dextroamphetamine (ADDERALL XR) 20 MG 24 hr capsule ° °

## 2015-06-10 ENCOUNTER — Telehealth: Payer: Self-pay | Admitting: Neurology

## 2015-06-10 MED ORDER — AMPHETAMINE-DEXTROAMPHET ER 20 MG PO CP24: 20.0000 mg | ORAL_CAPSULE | ORAL | Status: DC

## 2015-06-10 NOTE — Telephone Encounter (Signed)
Pt called to schedule initial f/u for CPAP, she started on 05/03/15. I relayed to her appt would need to be within 30-60 days start of CPAP. She said she could only come 2/27 or 4/13. Dr Vickey Huger is booked on 2/27 and 4/13 pushes appt past 60 days. Pt was made aware that insurance will not pay if outside of 60 day time frame. Pt sts she will call insurance company to get "extension" for CPAP visit. She will call back to check for cancellation on 06/17/15.

## 2015-06-10 NOTE — Telephone Encounter (Signed)
Called pt to inform her that her RX for adderall is ready for pick up at the front desk. Left a detailed message per DPR.

## 2015-06-10 NOTE — Telephone Encounter (Signed)
Pt is already scheduled for 06/17/15 with Dr. Vickey Huger.

## 2015-06-17 ENCOUNTER — Telehealth: Payer: Self-pay

## 2015-06-17 ENCOUNTER — Encounter: Payer: Self-pay | Admitting: Neurology

## 2015-06-17 ENCOUNTER — Ambulatory Visit (INDEPENDENT_AMBULATORY_CARE_PROVIDER_SITE_OTHER): Payer: 59 | Admitting: Neurology

## 2015-06-17 VITALS — BP 134/94 | HR 112 | Resp 20 | Ht 67.0 in | Wt 340.0 lb

## 2015-06-17 DIAGNOSIS — G47419 Narcolepsy without cataplexy: Secondary | ICD-10-CM | POA: Diagnosis not present

## 2015-06-17 MED ORDER — AMPHETAMINE-DEXTROAMPHET ER 20 MG PO CP24: 20.0000 mg | ORAL_CAPSULE | ORAL | Status: DC

## 2015-06-17 NOTE — Telephone Encounter (Signed)
Dr. Vickey Huger forgot to give pt her adderall RX at the end of the visit. Pt's RX is ready for pick up at the front desk.  I called pt to advise her, but no answer, left a message asking her to call me back. If pt calls back, please advise her of the above.

## 2015-06-17 NOTE — Telephone Encounter (Signed)
Pt returned call and says she just came by to pick it up today. Thank you

## 2015-06-17 NOTE — Progress Notes (Signed)
[facility]  Provider:  Melvyn Novas, M D  Referring Provider: Herb Grays, MD Primary Care Physician:  Herb Grays, MD  Chief Complaint  Patient presents with  . Follow-up    cpap, rm 11, alone    HPI:  Karen Hendricks is a 35 y.o. female , seen here as a revisit  from Karen Hendricks  for CPAP yearly compliance visit :  I have followed this patient since 2012 for a sleep problem. The patient was diagnosed with obstructive sleep apnea in 2010 at the time she was titrated to 11 cm a diagnostic polysomnography performed on 06-07-08 documented an AHI of 8.3 but an associated oxygen nadir of 69%. At the time Co2  was not measured , but we  assumed strongly that there was a component of obesity hypoventilation.  The patient also complained of insomnia at the time -she was using her CPAP machine compliantly over the last 4 years and since her excessive daytime sleepiness did not improve with CPAP alone was followed by MS LT study on 10-26-2008 ,which showed an amazingly short mean sleep latency of 2.9 minutes, no sleep onset REM periods-  this followed a PSG -Night of normal polysomnography on CPAP. Pressure of 11 cm water.  27th of December 2016, revisit yearly for Karen Hendricks. The patient is here for her yearly revisit. He continues to use CPAP, is highly compliant. She continues to smoke a moderate amount about 5-10 cigarettes a day, she has the same sleep habits that she has reported in the past. Karen Hendricks further reports that she still has trouble sleeping through the night. This has been the problem even when she took Xyrem. She can control her sleepiness to some degree on Adderall. She uses especially on work days. I wanted to offer her Ambien for only 2-3  Days week. She was diagnosed with ideopathic hypersomia. I I introduced the idea of Belsomra , and Glucophage have some samples or at least a coupon to see that she can try it without costs.   06-17-15 Patient with 100%  compliance on CPAP, AHI  2.5 and currently out of work for a broken bone in her left foot.  No parasomnia activity.  Eating healthier but cannot exercise on the knee scooter..   Review of Systems: Out of a complete 14 system review, the patient complains of only the following symptoms, and all other reviewed systems are negative.  Patient endorsed constipation low-grade fevers at night frequent awakening daytime sleepiness which still persists in spite of CPAP -she needing additional stimulants for the treatment of narcolepsy and excessive daytime sleepiness.  She had no intermittent family medical history or personal change in medical history the patient is already on Adderall XR and CPAP. Karen Hendricks has stopped using Xyrem because she was noticed sleepwalking - slept smoking with the associated high danger of setting her house on fire. Parasomnia is often XYREM induced , no sleep walking since being off XYREM.  She endorsed 06-17-15  an Epworth sleepiness score of 8 points and a fatigue severity score 59 points.  Her download for her CPAP machine shows a pressure setting of 11 cm water with 3 cm EPR a moderate air leak a residual AHI of 2.5  and an average daily use at time of 10 hours 4 minutes.   CPAP Compliance is 100% for  days of use as well as for 4 hours or more of daily user time.   She continues on Adderall.  Social History   Social History  . Marital Status: Married    Spouse Name: Karen Hendricks  . Number of Children: 1  . Years of Education: 12   Occupational History  . dental hygienist    Social History Main Topics  . Smoking status: Current Every Day Smoker -- 1.00 packs/day for 21 years    Types: Cigarettes  . Smokeless tobacco: Not on file  . Alcohol Use: 0.0 oz/week    0 Standard drinks or equivalent per week     Comment: rare  . Drug Use: No  . Sexual Activity: Not on file   Other Topics Concern  . Not on file   Social History Narrative    Family History   Problem Relation Age of Onset  . Hypertension Mother   . Psoriasis Father     Past Medical History  Diagnosis Date  . Narcolepsy   . Hypersomnia   . OSA on CPAP   . Sinus infection   . Hypersomnia, persistent 04/17/2013    Persistent hypersomnia on CPAP, abnormal MSLT - XYREM controls hypersomnia now, remaining on CPAP.   . IUD mechanical complication 09/05/2013  . Weight gain 03/2014    Cushing's??  Work up  . Hidradenitis suppurativa     Past Surgical History  Procedure Laterality Date  . Nasal sinus surgery  jan 2013  . Tonsillectomy    . Adenoid surgery    . Hysteroscopy N/A 09/07/2013    Procedure: HYSTEROSCOPY Removal of IUD arm;  Surgeon: Karen Rein, MD;  Location: [facility];  Service: Gynecology;  Laterality: N/A;    Current Outpatient Prescriptions  Medication Sig Dispense Refill  . amphetamine-dextroamphetamine (ADDERALL XR) 20 MG 24 hr capsule Take 1 capsule (20 mg total) by mouth every morning. Brand Medically Necessary 30 capsule 0  . Melatonin 10 MG CAPS 5 to 10 mg one hour before intended bedtime. (Patient taking differently: 10 mg. 5 to 10 mg one hour before intended bedtime.) 30 capsule 0  . metFORMIN (GLUCOPHAGE-XR) 500 MG 24 hr tablet 1,000 mg at bedtime.   2  . Suvorexant (BELSOMRA) 15 MG TABS Take 15 mg by mouth Nightly. 30 tablet 5   No current facility-administered medications for this visit.    Allergies as of 06/17/2015 - Review Complete 06/17/2015  Allergen Reaction Noted  . Codeine Itching   . Doxycycline Swelling 12/16/2012    Vitals: BP 134/94 mmHg  Pulse 112  Resp 20  Ht 5\' 7"  (1.702 m)  Wt 340 lb (154.223 kg)  BMI 53.24 kg/m2 Last Weight:  Wt Readings from Last 1 Encounters:  06/17/15 340 lb (154.223 kg)   Last Height:   Ht Readings from Last 1 Encounters:  06/17/15 5\' 7"  (1.702 m)    Physical exam: weight increased to above 50 BMI.   General: The patient is awake, alert and appears not in acute distress. The patient  is well groomed. Head: Normocephalic, atraumatic. Neck is supple. Mallampati 3 , neck circumference:17, 5  no TMJ  Cardiovascular:  Regular rate and rhythm, fast , without  murmurs or carotid bruit, and without distended neck veins. Respiratory: Lungs are clear to auscultation. Skin:  Without evidence of edema, or rash Trunk: BMI is  elevated and patient  has normal posture.  Neurologic exam : The patient is awake and alert, oriented to place and time.   There is a normal attention span & concentration ability. Speech is fluent without dysarthria, dysphonia or aphasia. Mood and affect are appropriate.  Cranial nerves: Pupils are equal and briskly reactive to light. Extraocular movements  in vertical and horizontal planes intact and without nystagmus. Visual fields by finger perimetry are intact. Hearing to finger rub intact. Facial motor strength is symmetric and tongue and uvula move midline.  Assessment:   After physical and neurologic examination, review of laboratory studies, imaging, neurophysiology testing and pre-existing records, assessment is : Pathological  hypersomnia.  Based on clinical symptoms  (and hypersomnia degree ) during MSLT.  Plan:   OSA on CPAP compliance record only needed once yearly.  Epworth is 10 points on Adderall.  Sleeps currently 10 hours - off work. Xyrem was discontinued in 2014 . Her fatigue severity score was 61 which remains very high. [facility] remains her  DME..  The patient has been suffering from  hypersomnia since age 56, and was suspected to have Cushing's disease - Dr. at [facility] ruled this condition out.     RV in 12 month. CPAP compliance.   CC; Dewain Penning, MD

## 2015-08-01 ENCOUNTER — Other Ambulatory Visit: Payer: Self-pay | Admitting: Neurology

## 2015-08-01 ENCOUNTER — Ambulatory Visit: Payer: 59 | Admitting: Neurology

## 2015-08-01 DIAGNOSIS — G47419 Narcolepsy without cataplexy: Secondary | ICD-10-CM

## 2015-08-01 MED ORDER — AMPHETAMINE-DEXTROAMPHET ER 20 MG PO CP24: 20.0000 mg | ORAL_CAPSULE | ORAL | Status: DC

## 2015-08-01 NOTE — Telephone Encounter (Signed)
Spoke to pt. I advised her that the adderall refill sent on 06/17/2015 was for 90 tablets, and therefor she is quite early in requesting a refill. Pt says that her pharmacy only gave her 30 tablets.  [facility] [address] [facility] spoke to Ple[place] HillsKristy, Teacher, early years/prepharmacist, who informed me that only 30 tablets of the adderall was dispensed because her insurance only allows 30 tablets to be dispensed at a time. Pt needs another RX to receive any more adderall.  I called pt and advised her of this. I advised her that her RX for adderall will be available for pick up tomorrow morning and gave clinic hours. Pt verbalized understanding.

## 2015-08-01 NOTE — Telephone Encounter (Signed)
Patient is calling to get a written Rx for amphetamine-dextroamphetamine (ADDERALL XR) 20 MG 24 hr capsule. I advised the Rx will be ready in 24 hours unless the nurse advises otherwise. °

## 2015-09-11 ENCOUNTER — Telehealth: Payer: Self-pay | Admitting: *Deleted

## 2015-09-11 DIAGNOSIS — G47419 Narcolepsy without cataplexy: Secondary | ICD-10-CM

## 2015-09-11 MED ORDER — AMPHETAMINE-DEXTROAMPHET ER 20 MG PO CP24: 20.0000 mg | ORAL_CAPSULE | ORAL | Status: DC

## 2015-09-11 NOTE — Telephone Encounter (Signed)
Pt returned call. She was told her rx was ready for pick up.

## 2015-09-11 NOTE — Telephone Encounter (Signed)
I called pt to advise her that her RX for adderall is ready for pick up at the front desk. No answer, left a message asking her to call me back. If pt calls back, please advise her of this information. 

## 2015-09-11 NOTE — Telephone Encounter (Signed)
Message For: [facility]                  Taken 24-MAY-17 at  1:24PM by JPO ------------------------------------------------------------ Olam Idleraller [employer]               CID [id number]  Patient SAME                 Pt's Dr Marylou FlesherHMIER      Area Code 336 Phone# [phone] * DOB [phone]     RE NEEDS REFILL ADDERALL SR 20MG                                                                           Disp:Y/N N If Y = C/B If No Response In 20minutes ============================================================

## 2015-10-23 ENCOUNTER — Other Ambulatory Visit: Payer: Self-pay | Admitting: Neurology

## 2015-10-23 DIAGNOSIS — G47419 Narcolepsy without cataplexy: Secondary | ICD-10-CM

## 2015-10-23 MED ORDER — AMPHETAMINE-DEXTROAMPHET ER 20 MG PO CP24: 20.0000 mg | ORAL_CAPSULE | ORAL | Status: DC

## 2015-10-23 NOTE — Telephone Encounter (Signed)
Dr. Vickey Hendricks pt, seen for narcolepsy Last OV Feb 2017 w/ 1 yr f/u scheduled Feb 2018 Last refilled 09/11/15

## 2015-10-23 NOTE — Telephone Encounter (Signed)
Rx printed, signed, up front for pick-up. 

## 2015-10-23 NOTE — Telephone Encounter (Signed)
Patient requesting refill of amphetamine-dextroamphetamine (ADDERALL XR) 20 MG 24 hr capsule. I advised the Rx will be ready in 24 hours unless the nurse advises otherwise. ° ° °

## 2015-10-28 ENCOUNTER — Other Ambulatory Visit: Payer: Self-pay

## 2015-10-28 MED ORDER — SUVOREXANT 15 MG PO TABS
15.0000 mg | ORAL_TABLET | Freq: Every evening | ORAL | Status: DC
Start: 2015-10-28 — End: 2016-05-01

## 2015-10-28 NOTE — Telephone Encounter (Signed)
[facility] Received a receipt of confirmation.  

## 2015-12-20 ENCOUNTER — Telehealth: Payer: Self-pay | Admitting: Neurology

## 2015-12-20 DIAGNOSIS — G47419 Narcolepsy without cataplexy: Secondary | ICD-10-CM

## 2015-12-20 MED ORDER — AMPHETAMINE-DEXTROAMPHET ER 20 MG PO CP24: 20.0000 mg | ORAL_CAPSULE | ORAL | 0 refills | Status: DC

## 2015-12-20 NOTE — Telephone Encounter (Signed)
Patient requesting refill of amphetamine-dextroamphetamine (ADDERALL XR) 20 MG 24 hr capsule °Pharmacy pick up ° °

## 2015-12-20 NOTE — Telephone Encounter (Signed)
Filled, CD

## 2015-12-24 NOTE — Telephone Encounter (Signed)
Rx placed at front desk for pick up.  

## 2016-02-07 ENCOUNTER — Telehealth: Payer: Self-pay | Admitting: Neurology

## 2016-02-07 NOTE — Telephone Encounter (Signed)
Patient called to request refill of amphetamine-dextroamphetamine (ADDERALL XR) 20 MG 24 hr capsule °

## 2016-02-17 MED ORDER — AMPHETAMINE-DEXTROAMPHET ER 20 MG PO CP24: 20.0000 mg | ORAL_CAPSULE | ORAL | 0 refills | Status: DC

## 2016-02-17 NOTE — Telephone Encounter (Signed)
I called pt to advise her that her RX for adderall is available for pick up at the front desk. No answer, left a message asking her to call me back. If pt calls back, please advise her of this information. 

## 2016-02-17 NOTE — Telephone Encounter (Signed)
Patient is calling to get another written Rx for amphetamine-dextroamphetamine (ADDERALL XR) 20 MG 24 hr capsule. The patient picked up a Rx last Friday but it was dated 06-17-15 and the pharmacy would not fill. I advised the Rx will be ready in 24 hours unless the nurse advises otherwise.

## 2016-02-17 NOTE — Addendum Note (Signed)
Addended by: Geronimo RunningINKINS, Marise Knapper A on: 02/17/2016 01:22 PM   Modules accepted: Orders

## 2016-02-18 NOTE — Telephone Encounter (Signed)
Pt called RN back, msg relayed. She understood

## 2016-04-01 ENCOUNTER — Telehealth: Payer: Self-pay | Admitting: Neurology

## 2016-04-01 MED ORDER — AMPHETAMINE-DEXTROAMPHET ER 20 MG PO CP24: 20.0000 mg | ORAL_CAPSULE | ORAL | 0 refills | Status: DC

## 2016-04-01 NOTE — Telephone Encounter (Signed)
Pt request refill for amphetamine-dextroamphetamine (ADDERALL XR) 20 MG 24 hr capsule °

## 2016-04-01 NOTE — Telephone Encounter (Signed)
Rx printed, Rx due, patient up to date on appts.

## 2016-04-02 NOTE — Telephone Encounter (Signed)
LM for patient stating that her Rx is at front desk ready for p/u.

## 2016-04-16 ENCOUNTER — Ambulatory Visit: Payer: 59 | Admitting: Neurology

## 2016-05-01 ENCOUNTER — Other Ambulatory Visit: Payer: Self-pay | Admitting: Neurology

## 2016-05-04 NOTE — Telephone Encounter (Signed)
[facility] [address]. Received a receipt of confirmation.

## 2016-06-04 ENCOUNTER — Other Ambulatory Visit: Payer: Self-pay | Admitting: Neurology

## 2016-06-04 MED ORDER — AMPHETAMINE-DEXTROAMPHET ER 20 MG PO CP24: 20.0000 mg | ORAL_CAPSULE | ORAL | 0 refills | Status: DC

## 2016-06-04 NOTE — Telephone Encounter (Signed)
I left a message on pt's cell number (per DPR) advising her that her RX for adderall is ready for pick up at the front desk and I gave clinic hours. I asked pt to call back with any questions/concerns.

## 2016-06-04 NOTE — Telephone Encounter (Signed)
Pt request refill for amphetamine-dextroamphetamine (ADDERALL XR) 20 MG 24 hr capsule pt is aware the office closes at noon tomorrow

## 2016-06-04 NOTE — Telephone Encounter (Signed)
Pt is due for a refill on adderall and is up to date on her appts. RX printed, waiting for Dr. Oliva Bustardohmeier's signature.

## 2016-06-10 ENCOUNTER — Other Ambulatory Visit: Payer: Self-pay | Admitting: Neurology

## 2016-06-15 ENCOUNTER — Ambulatory Visit: Payer: 59 | Admitting: Neurology

## 2016-09-10 ENCOUNTER — Encounter (INDEPENDENT_AMBULATORY_CARE_PROVIDER_SITE_OTHER): Payer: Self-pay

## 2016-09-10 ENCOUNTER — Encounter: Payer: Self-pay | Admitting: Neurology

## 2016-09-10 ENCOUNTER — Ambulatory Visit (INDEPENDENT_AMBULATORY_CARE_PROVIDER_SITE_OTHER): Payer: 59 | Admitting: Neurology

## 2016-09-10 VITALS — BP 156/94 | HR 95 | Resp 20 | Ht 67.0 in | Wt 349.0 lb

## 2016-09-10 DIAGNOSIS — G4712 Idiopathic hypersomnia without long sleep time: Secondary | ICD-10-CM

## 2016-09-10 DIAGNOSIS — Z9989 Dependence on other enabling machines and devices: Secondary | ICD-10-CM

## 2016-09-10 DIAGNOSIS — G4733 Obstructive sleep apnea (adult) (pediatric): Secondary | ICD-10-CM

## 2016-09-10 DIAGNOSIS — E669 Obesity, unspecified: Secondary | ICD-10-CM | POA: Insufficient documentation

## 2016-09-10 MED ORDER — AMPHETAMINE-DEXTROAMPHET ER 20 MG PO CP24: ORAL_CAPSULE | ORAL | 0 refills | Status: DC

## 2016-09-10 NOTE — Progress Notes (Signed)
[facility]  Provider:  Melvyn Novas, M D  Referring Provider: No ref. provider found Primary Care Physician:  Herb Grays, MD (Inactive)  Chief Complaint  Patient presents with  . Follow-up    not taking adderall or belsomra right now    HPI:  Karen Hendricks is a 36 y.o. female , seen here as a revisit  from Dr. Collins [place]  for CPAP yearly compliance visit :  I have followed this patient since 2012 for a sleep problem. The patient was diagnosed with obstructive sleep apnea in 2010 at the time she was titrated to 11 cm a diagnostic polysomnography performed on 06-07-08 documented an AHI of 8.3 but an associated oxygen nadir of 69%. At the time Co2  was not measured , but we  assumed strongly that there was a component of obesity hypoventilation.  The patient also complained of insomnia at the time -she was using her CPAP machine compliantly over the last 4 years and since her excessive daytime sleepiness did not improve with CPAP alone was followed by MS LT study on 10-26-2008 ,which showed an amazingly short mean sleep latency of 2.9 minutes, no sleep onset REM periods-  this followed a PSG -Night of normal polysomnography on CPAP. Pressure of 11 cm water.  27th of December 2016, revisit yearly for Karen Hendricks. The patient is here for her yearly revisit. He continues to use CPAP, is highly compliant. She continues to smoke a moderate amount about 5-10 cigarettes a day, she has the same sleep habits that she has reported in the past. [name] further reports that she still has trouble sleeping through the night. This has been the problem even when she took Xyrem. She can control her sleepiness to some degree on Adderall. She uses especially on work days. I wanted to offer her Ambien for only 2-3  Days week. She was diagnosed with ideopathic hypersomia. I I introduced the idea of Belsomra , and Glucophage have some samples or at least a coupon to see that she can try it without  costs.   06-17-15 Patient with 100% compliance on CPAP, AHI  2.5 and currently out of work for a broken bone in her left foot.  No parasomnia activity. OSA on CPAP compliance record only needed once yearly.  Epworth is 10 points on Adderall.  Sleeps currently 10 hours - off work. Xyrem was discontinued in 2014 . Her fatigue severity score was 61 which remains very high. [facility] remains her  DME..  The patient has been suffering from  hypersomnia since age 52, and was suspected to have Cushing's disease - Drs. at [facility] ruled this condition out.   Interval history from 09/10/2016, I am seeing Karen Hendricks today who has been 100% compliant CPAP user with an average user time of 8 hours and 46 minutes on 11 cm CPAP with a residual AHI of only 2.4 no major air leaks are noted. Her fatigue severity score remains very high at 63 her Epworth sleepiness score is 11. The patient is a participant in a idiopathic hypersomnia patient group. She learned about some trials of using medication such as clarithromycin and flumazenil cream. I certainly cannot find a reason not to try flumazenil cream as it is not systemically working. There has been a research group she informed me of at [place], [place], [place]. The trial is supposingly closed.      Review of Systems: Out of a complete 14 system review, the patient complains  of only the following symptoms, and all other reviewed systems are negative.  Patient endorsed constipation low-grade fevers at night frequent awakening daytime sleepiness which still persists in spite of CPAP -she needing additional stimulants for the treatment of narcolepsy and excessive daytime sleepiness.  She had no intermittent family medical history or personal change in medical history the patient is already on Adderall XR and CPAP. Mrs. Karen Hendricks has stopped using Xyrem because she was noticed sleepwalking - slept smoking with the associated high danger of setting her house on fire.  Parasomnia is often XYREM induced , no sleep walking since being off XYREM.  She endorsed 06-17-15  an Epworth sleepiness score of 8 points and a fatigue severity score 59 points.  Her download for her CPAP machine shows a pressure setting of 11 cm water with 3 cm EPR a moderate air leak a residual AHI of 2.5  and an average daily use at time of 10 hours 4 minutes.   CPAP Compliance is 100% for  days of use as well as for 4 hours or more of daily user time.   She continues on Adderall.    Social History   Social History  . Marital status: Married    Spouse name: Jonny Ruizjohn  . Number of children: 1  . Years of education: 3012   Occupational History  . 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 Smoking status: Current Every Day Smoker    Packs/day: 1.00    Years: 21.00    Types: Cigarettes  . Smokeless tobacco: Never Used  . Alcohol use 0.0 oz/week     Comment: rare  . Drug use: No  . Sexual activity: Not on file   Other Topics Concern  . Not on file   Social History Narrative  . No narrative on file    Family History  Problem Relation Age of Onset  . Hypertension Mother   . Psoriasis Father     Past Medical History:  Diagnosis Date  . Hidradenitis suppurativa   . Hypersomnia   . Hypersomnia, persistent 04/17/2013   Persistent hypersomnia on CPAP, abnormal MSLT - XYREM controls hypersomnia now, remaining on CPAP.   . IUD mechanical complication 09/05/2013  . Narcolepsy   . OSA on CPAP   . Sinus infection   . Weight gain 03/2014   Cushing's??  Work up    Past Surgical History:  Procedure Laterality Date  . adenoid surgery    . HYSTEROSCOPY N/A 09/07/2013   Procedure: HYSTEROSCOPY Removal of IUD arm;  Surgeon: Sherian ReinJody Bovard-Stuckert, MD;  Location: [facility];  Service: Gynecology;  Laterality: N/A;  . NASAL SINUS SURGERY  jan 2013  . TONSILLECTOMY      Current Outpatient Prescriptions  Medication Sig Dispense Refill  . Melatonin 10 MG  CAPS 5 to 10 mg one hour before intended bedtime. (Patient taking differently: 10 mg. 5 to 10 mg one hour before intended bedtime.) 30 capsule 0  . amphetamine-dextroamphetamine (ADDERALL XR) 20 MG 24 hr capsule Take 1 capsule (20 mg total) by mouth every morning. Brand Medically Necessary (Patient not taking: Reported on 09/10/2016) 30 capsule 0  . BELSOMRA 15 MG TABS TAKE 1 TABLET BY MOUTH EVERY EVENING (Patient not taking: Reported on 09/10/2016) 30 tablet 5   No current facility-administered medications for this visit.     Allergies as of 09/10/2016 - Review Complete 09/10/2016  Allergen Reaction Noted  . Codeine Itching   .  Doxycycline Swelling 12/16/2012    Vitals: BP (!) 156/94   Pulse 95   Resp 20   Ht 5\' 7"  (1.702 m)   Wt (!) 349 lb (158.3 kg)   BMI 54.66 kg/m  Last Weight:  Wt Readings from Last 1 Encounters:  09/10/16 (!) 349 lb (158.3 kg)   Last Height:   Ht Readings from Last 1 Encounters:  09/10/16 5\' 7"  (1.702 m)    Physical exam: weight increased - 55 BMI.   General: The patient is awake, alert and appears not in acute distress. The patient is well groomed. Head: Normocephalic, atraumatic. Neck is supple. Mallampati 3 , neck circumference:17, 5  no TMJ  Cardiovascular:  Regular rate and rhythm, fast , without  murmurs or carotid bruit, and without distended neck veins. Respiratory: Lungs are clear to auscultation. Skin:  Without evidence of edema, or rash Trunk: BMI is elevated - patient  has normal posture.  Neurologic exam : The patient is awake and alert, oriented to place and time. There is a normal attention span & concentration ability. Speech is fluent without dysarthria, dysphonia or aphasia. Mood and affect are appropriate.  Cranial nerves: Pupils are equal and briskly reactive to light. Extraocular movements  in vertical and horizontal planes intact and without nystagmus. Visual fields by finger perimetry are intact. Hearing to finger rub intact.  Facial motor strength is symmetric and tongue and uvula move midline.  Assessment:   After physical and neurologic examination, review of laboratory studies, imaging, neurophysiology testing and pre-existing records, assessment is : Pathological idiopathic  hypersomnia. Based on clinical symptoms  (and hypersomnia degree ) during MSLT.  Plan:  She continues to gain weight   Mrs. Char suffers from continues high degrees of fatigue is a 50 severity of 63, and hypersomnia at an Epworth score of 11. All the while she is  A very compliant CPAP user. It is not obstructive sleep apnea at this time that causes her to be sleepy and fatigued. Her idiopathic hypersomnia has responded to stimulant medication but the effect does not last. The patient has done research and I just looked up some additional articles about a trial for the treatment of idiopathic hypersomnia in [place] he was a at [employer]. Clarithromycin and flumazenil were used in these trials and had some desired effects from sleepiness. Apparently flumazenil given as a cream- not injected and not a patch. I would be interested in trying this out with her.    RV in 4-6 month. CPAP compliance.   CC; Dewain Penning, MD

## 2016-09-10 NOTE — Addendum Note (Signed)
Addended by: Melvyn NovasHMEIER, Cosby Proby on: 09/10/2016 10:38 AM   Modules accepted: Orders

## 2016-09-10 NOTE — Patient Instructions (Signed)
Flumazinil cream - through [facility], [place].

## 2016-09-22 ENCOUNTER — Telehealth: Payer: Self-pay | Admitting: *Deleted

## 2016-09-22 NOTE — Telephone Encounter (Signed)
PA approved, for medication and quantity, by [facility] [id number]) through 10/02/17.  JW#[id number].  PT NF#[id number].  [facility] [address] notified of approval and will fill medication for patient.

## 2016-09-23 NOTE — Telephone Encounter (Signed)
[facility]ristie with [facility] is still having trouble with patient PA for adderall.  Kristie contacted [facility] and they stated only the drug was approved not the quantity, she than gave them the PA # and they clarified it is just the medication not the quantity.  Please call

## 2016-09-24 NOTE — Telephone Encounter (Signed)
Called [facility] again - they entered the PA incorrectly and have fixed the error.  [facility]ed [facility] and the prescription is now paying through her insurance.  Returned call to patient to let her know it has been resolved.

## 2016-09-28 NOTE — Telephone Encounter (Signed)
[facility]'s [facility]der[facility] was approved until 09/22/17.

## 2016-12-10 ENCOUNTER — Telehealth: Payer: Self-pay | Admitting: Neurology

## 2016-12-10 ENCOUNTER — Other Ambulatory Visit: Payer: Self-pay | Admitting: Neurology

## 2016-12-10 MED ORDER — AMPHETAMINE-DEXTROAMPHET ER 20 MG PO CP24: ORAL_CAPSULE | ORAL | 0 refills | Status: DC

## 2016-12-10 NOTE — Telephone Encounter (Signed)
Pt request refill for amphetamine-dextroamphetamine (ADDERALL XR) 20 MG 24 hr capsule °

## 2017-03-09 ENCOUNTER — Encounter: Payer: Self-pay | Admitting: Adult Health

## 2017-03-10 ENCOUNTER — Ambulatory Visit: Payer: 59 | Admitting: Neurology

## 2017-03-10 ENCOUNTER — Ambulatory Visit: Payer: 59 | Admitting: Adult Health

## 2017-03-10 ENCOUNTER — Encounter (INDEPENDENT_AMBULATORY_CARE_PROVIDER_SITE_OTHER): Payer: Self-pay

## 2017-03-10 ENCOUNTER — Encounter: Payer: Self-pay | Admitting: Adult Health

## 2017-03-10 DIAGNOSIS — Z9989 Dependence on other enabling machines and devices: Secondary | ICD-10-CM | POA: Diagnosis not present

## 2017-03-10 DIAGNOSIS — G479 Sleep disorder, unspecified: Secondary | ICD-10-CM | POA: Diagnosis not present

## 2017-03-10 DIAGNOSIS — G4710 Hypersomnia, unspecified: Secondary | ICD-10-CM

## 2017-03-10 DIAGNOSIS — G4733 Obstructive sleep apnea (adult) (pediatric): Secondary | ICD-10-CM | POA: Diagnosis not present

## 2017-03-10 MED ORDER — ESZOPICLONE 1 MG PO TABS: 1.0000 mg | ORAL_TABLET | Freq: Every evening | ORAL | 0 refills | Status: DC | PRN

## 2017-03-10 MED ORDER — TRAZODONE HCL 50 MG PO TABS: 25.0000 mg | ORAL_TABLET | Freq: Every day | ORAL | 3 refills | Status: DC

## 2017-03-10 NOTE — Patient Instructions (Addendum)
Your Plan:  Continue Adderall Can try Lunesta 1 mg at bedtime to see if it offers any benefit with sleep If your symptoms worsen or you develop new symptoms please let us know.   [facility]. I hope we have been able to provide you high quality care today.  You may receive a patient satisfaction survey over the next few weeks. We would appreciate your feedback and comments so that we may continue to improve ourselves and the health of our patients.  Eszopiclone tablets What is this medicine? ESZOPICLONE (es ZOE pi clone) is used to treat insomnia. This medicine helps you to fall asleep and sleep through the night. This medicine may be used for other purposes; ask your health care provider or pharmacist if you have questions. COMMON BRAND NAME(S): Lunesta What should I tell my health care provider before I take this medicine? They need to know if you have any of these conditions: -depression -history of a drug or alcohol abuse problem -liver disease -lung or breathing disease -suicidal thoughts -an unusual or allergic reaction to eszopiclone, other medicines, foods, dyes, or preservatives -pregnant or trying to get pregnant -breast-feeding How should I use this medicine? Take this medicine by mouth with a glass of water. Follow the directions on the prescription label. It is better to take this medicine on an empty stomach and only when you are ready for bed. Do not take your medicine more often than directed. If you have been taking this medicine for several weeks and suddenly stop taking it, you may get unpleasant withdrawal symptoms. Your doctor or health care professional may want to gradually reduce the dose. Do not stop taking this medicine on your own. Always follow your doctor or health care professional's advice. Talk to your pediatrician regarding the use of this medicine in children. Special care may be needed. Overdosage: If  you think you have taken too much of this medicine contact a poison control center or emergency room at once. NOTE: This medicine is only for you. Do not share this medicine with others. What if I miss a dose? This does not apply. This medicine should only be taken immediately before going to sleep. Do not take double or extra doses. What may interact with this medicine? -herbal medicines like kava kava, melatonin, St. John's wort and valerian -lorazepam -medicines for fungal infections like ketoconazole, fluconazole, or itraconazole -olanzapine This list may not describe all possible interactions. Give your health care provider a list of all the medicines, herbs, non-prescription drugs, or dietary supplements you use. Also tell them if you smoke, drink alcohol, or use illegal drugs. Some items may interact with your medicine. What should I watch for while using this medicine? Visit your doctor or health care professional for regular checks on your progress. Keep a regular sleep schedule by going to bed at about the same time nightly. Avoid caffeine-containing drinks in the evening hours, as caffeine can cause trouble with falling asleep. Talk to your doctor if you still have trouble sleeping. After taking this medicine for sleep, you may get up out of bed while not being fully awake and do an activity that you do not know you are doing. The next morning, you may have no memory of the event. Activities such as driving a car ("sleep-driving"), making and eating food, talking on the phone, sexual activity, and sleep-walking have been reported. Call your doctor right away if you find out  you have done any of these activities. Do not take this medicine if you have used alcohol that evening or before bed or taken another medicine for sleep, since your risk of doing these sleep-related activities will be increased. Do not take this medicine unless you are able to stay in bed for a full night (7 to 8 hours)  before you must be active again. You may have a decrease in mental alertness the day after use, even if you feel that you are fully awake. Tell your doctor if you will need to perform activities requiring full alertness, such as driving, the next day. Do not stand or sit up quickly after taking this medicine, especially if you are an older patient. This reduces the risk of dizzy or fainting spells. If you or your family notice any changes in your behavior, such as new or worsening depression, thoughts of harming yourself, anxiety, other unusual or disturbing thoughts, or memory loss, call your doctor right away. After you stop taking this medicine, you may have trouble falling asleep. This is called rebound insomnia. This problem usually goes away on its own after 1 or 2 nights. What side effects may I notice from receiving this medicine? Side effects that you should report to your doctor or health care professional as soon as possible: -allergic reactions like skin rash, itching or hives, swelling of the face, lips, or tongue -changes in vision -confusion -depressed mood -feeling faint or lightheaded, falls -hallucinations -problems with balance, speaking, walking -restlessness, excitability, or feelings of agitation -unusual activities while asleep like driving, eating, making phone calls Side effects that usually do not require medical attention (report to your doctor or health care professional if they continue or are bothersome): -dizziness, or daytime drowsiness, sometimes called a hangover effect -headache This list may not describe all possible side effects. Call your doctor for medical advice about side effects. You may report side effects to FDA at 1-800-FDA-1088. Where should I keep my medicine? Keep out of the reach of children. This medicine can be abused. Keep your medicine in a safe place to protect it from theft. Do not share this medicine with anyone. Selling or giving away this  medicine is dangerous and against the law. This medicine may cause accidental overdose and death if taken by other adults, children, or pets. Mix any unused medicine with a substance like cat litter or coffee grounds. Then throw the medicine away in a sealed container like a sealed bag or a coffee can with a lid. Do not use the medicine after the expiration date. Store at room temperature between 15 and 30 degrees C (59 and 86 degrees F). NOTE: This sheet is a summary. It may not cover all possible information. If you have questions about this medicine, talk to your doctor, pharmacist, or health care provider.  2018 Elsevier/Gold Standard (2013-12-26 15:22:01)

## 2017-03-10 NOTE — Progress Notes (Signed)
PATIENT: Karen Hendricks DOB: 1981/03/30  REASON FOR VISIT: follow up HISTORY FROM: patient  HISTORY OF PRESENT ILLNESS: Today 03/10/17  Karen Hendricks is a 36 year old female with a history of obstructive sleep apnea on CPAP, hypersomnolence and sleep disturbance.  She returns today for follow-up.  Her CPAP download indicates that she use her machine 30 out of 30 days for compliance of 100%.  She uses her machine greater than 4 hours each night.  On average she uses her machine 8 hours and 28 minutes.  Her residual AHI is 2.3 on 11 cm of water with EPR of 2.  She does not have a significant leak.  She continues to take Adderall with good benefit during the day.  She states that if she was able to lay down she would be able to fall asleep on it but she is not having trouble staying awake during the day.  She states that she during the week she wakes up 3-4 times during the night.  On the weekends she wakes up more often since she is able to sleep longer.  She has tried Belsomra, xyrem, Celexa, Wellbutrin and melatonin without good benefit.  The patient states that she would like to be able to sleep all night long as she feels that she would feel more rested during the day.  She returns today for an evaluation.  HISTORY  09/10/2016, I am seeing Karen Hendricks today who has been 100% compliant CPAP user with an average user time of 8 hours and 46 minutes on 11 cm CPAP with a residual AHI of only 2.4 no major air leaks are noted. Her fatigue severity score remains very high at 63 her Epworth sleepiness score is 11. The patient is a participant in a idiopathic hypersomnia patient group. She learned about some trials of using medication such as clarithromycin and flumazenil cream. I certainly cannot find a reason not to try flumazenil cream as it is not systemically working. There has been a research group she informed me of at [facility], [place], [place]. The trial is supposingly closed.   REVIEW OF SYSTEMS: Out of a  complete 14 system review of symptoms, the patient complains only of the following symptoms, and all other reviewed systems are negative.  See HPI  ALLERGIES: Allergies  Allergen Reactions  . Codeine Itching    REACTION: itch insomnia  . Doxycycline Swelling    Angioedema Lips swell    HOME MEDICATIONS: Outpatient Medications Prior to Visit  Medication Sig Dispense Refill  . amphetamine-dextroamphetamine (ADDERALL XR) 20 MG 24 hr capsule Brand Medically Necessary. Patient can take 1-2 capsules as needed. 60 capsule 0  . Melatonin 10 MG CAPS 5 to 10 mg one hour before intended bedtime. (Patient taking differently: 10 mg. 5 to 10 mg one hour before intended bedtime.) 30 capsule 0   No facility-administered medications prior to visit.     PAST MEDICAL HISTORY: Past Medical History:  Diagnosis Date  . Hidradenitis suppurativa   . Hypersomnia   . Hypersomnia, persistent 04/17/2013   Persistent hypersomnia on CPAP, abnormal MSLT - XYREM controls hypersomnia now, remaining on CPAP.   . IUD mechanical complication 09/05/2013  . Narcolepsy   . OSA on CPAP   . Sinus infection   . Weight gain 03/2014   Cushing's??  Work up    PAST SURGICAL HISTORY: Past Surgical History:  Procedure Laterality Date  . adenoid surgery    . HYSTEROSCOPY N/A 09/07/2013   Procedure: HYSTEROSCOPY  Removal of IUD arm;  Surgeon: Sherian Rein, MD;  Location: [facility];  Service: Gynecology;  Laterality: N/A;  . NASAL SINUS SURGERY  jan 2013  . TONSILLECTOMY      FAMILY HISTORY: Family History  Problem Relation Age of Onset  . Hypertension Mother   . Psoriasis Father     SOCIAL HISTORY: Social History   Socioeconomic History  . Marital status: Married    Spouse name: [name]  . Number of children: 1  . Years of education: 36  . Highest education level: Not on file  Social Needs  . Financial resource strain: Not on file  . Food insecurity - worry: Not on file  . Food insecurity -  inability: Not on file  . Transportation needs - medical: Not on file  . Transportation needs - non-medical: Not on file  Occupational History  . Occupation: Programmer, multimedia: [employer]  Tobacco Use  . Smoking status: Current Every Day Smoker    Packs/day: 1.00    Years: 21.00    Pack years: 21.00    Types: Cigarettes  . Smokeless tobacco: Never Used  Substance and Sexual Activity  . Alcohol use: Yes    Alcohol/week: 0.0 oz    Comment: rare  . Drug use: No  . Sexual activity: Not on file  Other Topics Concern  . Not on file  Social History Narrative  . Not on file      PHYSICAL EXAM  Vitals:   03/10/17 1303  BP: (!) 147/83  Pulse: (!) 114  Weight: (!) 352 lb 8 oz (159.9 kg)  Height: 5\' 7"  (1.702 m)   Body mass index is 55.21 kg/m.  Generalized: Well developed, in no acute distress   Neurological examination  Mentation: Alert oriented to time, place, history taking. Follows all commands speech and language fluent Cranial nerve II-XII: Pupils were equal round reactive to light. Extraocular movements were full, visual field were full on confrontational test. Facial sensation and strength were normal. Uvula tongue midline. Head turning and shoulder shrug  were normal and symmetric. Motor: The motor testing reveals 5 over 5 strength of all 4 extremities. Good symmetric motor tone is noted throughout.  Sensory: Sensory testing is intact to soft touch on all 4 extremities. No evidence of extinction is noted.  Coordination: Cerebellar testing reveals good finger-nose-finger and heel-to-shin bilaterally.  Gait and station: Gait is normal. Tandem gait is normal. Romberg is negative. No drift is seen.  Reflexes: Deep tendon reflexes are symmetric and normal bilaterally.   DIAGNOSTIC DATA (LABS, IMAGING, TESTING) - I reviewed patient records, labs, notes, testing and imaging myself where available.  Lab Results  Component Value Date   WBC 7.5  09/07/2013   HGB 14.3 09/07/2013   HCT 41.7 09/07/2013   MCV 94.3 09/07/2013   PLT 241 09/07/2013      ASSESSMENT AND PLAN 36 y.o. year old female  has a past medical history of Hidradenitis suppurativa, Hypersomnia, Hypersomnia, persistent (04/17/2013), IUD mechanical complication (09/05/2013), Narcolepsy, OSA on CPAP, Sinus infection, and Weight gain (03/2014). here with:  1.  Obstructive sleep apnea on CPAP 2.  Hypersomnolence 3.  Sleep disturbance 4.  Obesity  The patient CPAP download shows excellent compliance and good treatment of her apnea.  She is encouraged to continue using her CPAP nightly.  She will continue Adderall for daytime sleepiness.  I discussed with Dr. Vickey Huger but she recommended trying 1 mg of Lunesta at  bedtime to see if this helps with her sleep.  Patient is amenable to trying this medication.  I have reviewed side effects with patient and provided her with a handout.  The patient would also like to lose weight.  I will make a referral to Dr. Dalbert [facility]ley at [facility].  Patient will follow-up in 6 months or sooner if needed.  Butch PennyMegan Tylicia Sherman, MSN, NP-C 03/10/2017, 11:35 AM [facility] [address], [address] [place], [place] [address] [phone]

## 2017-03-25 ENCOUNTER — Telehealth: Payer: Self-pay

## 2017-03-25 NOTE — Telephone Encounter (Signed)
LMVM for pt to return call for response of lunesta.

## 2017-03-25 NOTE — Telephone Encounter (Signed)
Patient was advised to call back with an update on how the Karen PattenLunesta is working and she says that she has not had any side effects from it and says that she can feel a slight difference, but she's wondering if we can up the dose to 2mg .

## 2017-03-26 NOTE — Telephone Encounter (Signed)
LMVM for pt

## 2017-03-26 NOTE — Telephone Encounter (Signed)
Patient is returning your call.  

## 2017-03-31 MED ORDER — ESZOPICLONE 2 MG PO TABS: 2.0000 mg | ORAL_TABLET | Freq: Every evening | ORAL | 5 refills | Status: DC | PRN

## 2017-03-31 NOTE — Telephone Encounter (Signed)
Dose increased to 2 mg before bedtime if needed for sleep.

## 2017-03-31 NOTE — Addendum Note (Signed)
Addended by: Enedina FinnerMILLIKAN, Kayleann Mccaffery P on: 03/31/2017 04:05 PM   Modules accepted: Orders

## 2017-03-31 NOTE — Telephone Encounter (Signed)
lmvm for pt that MM/NP did escribe prescription for lunesta 2mg  po take qhs prn for sleep. Pt to call back if questions.

## 2017-04-15 ENCOUNTER — Telehealth: Payer: Self-pay | Admitting: Neurology

## 2017-04-15 ENCOUNTER — Other Ambulatory Visit: Payer: Self-pay | Admitting: Neurology

## 2017-04-15 MED ORDER — AMPHETAMINE-DEXTROAMPHET ER 20 MG PO CP24: ORAL_CAPSULE | ORAL | 0 refills | Status: DC

## 2017-04-15 NOTE — Telephone Encounter (Signed)
Pt is needing refill for amphetamine-dextroamphetamine (ADDERALL XR) 20 MG 24 hr capsule

## 2017-04-15 NOTE — Telephone Encounter (Signed)
Script will be ready for pick up this afternoon. Placed at the front desk.

## 2017-05-14 ENCOUNTER — Telehealth: Payer: Self-pay | Admitting: Neurology

## 2017-05-14 NOTE — Telephone Encounter (Signed)
Pt is calling wanting to let Dr. Vickey Hugerohmeier that pt is interested in the cross over study that Dr. Vickey Hugerohmeier had mailed to her. Pt said if needed to call please do or we are more than welcome to just fax over her records

## 2017-05-17 ENCOUNTER — Other Ambulatory Visit: Payer: Self-pay | Admitting: Neurology

## 2017-05-17 DIAGNOSIS — G4710 Hypersomnia, unspecified: Secondary | ICD-10-CM

## 2017-05-17 NOTE — Progress Notes (Signed)
This will go to a research study , CD

## 2017-05-17 NOTE — Telephone Encounter (Signed)
Karen Hendricks, can we print her sleep tests and her last 2 visits, current medication. I would like to refer her to dr Ephraim HamburgerM PERKINS, [facility] at [facility] for a hypersomnia study.

## 2017-05-25 ENCOUNTER — Encounter: Payer: Self-pay | Admitting: Neurology

## 2017-06-07 ENCOUNTER — Telehealth: Payer: Self-pay | Admitting: Neurology

## 2017-06-07 NOTE — Telephone Encounter (Signed)
[facility] Has not been able to reach patient to schedule an apt.  I called patient as well and left her a message with there telephone number [phone].

## 2017-08-05 ENCOUNTER — Ambulatory Visit: Payer: 59 | Admitting: Neurology

## 2017-08-09 ENCOUNTER — Telehealth: Payer: Self-pay | Admitting: Neurology

## 2017-08-09 ENCOUNTER — Other Ambulatory Visit: Payer: Self-pay | Admitting: Neurology

## 2017-08-09 MED ORDER — AMPHETAMINE-DEXTROAMPHET ER 20 MG PO CP24: ORAL_CAPSULE | ORAL | 0 refills | Status: DC

## 2017-08-09 NOTE — Telephone Encounter (Signed)
Pt requesting a refill for amphetamine-dextroamphetamine (ADDERALL XR) 20 MG 24 hr capsule sent to [facility]

## 2017-08-09 NOTE — Telephone Encounter (Signed)
I have routed this request to the work in MD to view

## 2017-11-25 ENCOUNTER — Other Ambulatory Visit: Payer: Self-pay | Admitting: Neurology

## 2017-11-25 ENCOUNTER — Telehealth: Payer: Self-pay | Admitting: Neurology

## 2017-11-25 MED ORDER — AMPHETAMINE-DEXTROAMPHET ER 20 MG PO CP24: ORAL_CAPSULE | ORAL | 0 refills | Status: DC

## 2017-11-25 NOTE — Telephone Encounter (Signed)
Pt request refill for amphetamine-dextroamphetamine (ADDERALL XR) 20 MG 24 hr capsule [facility] to [id number] [place], [place] - [address]

## 2017-11-25 NOTE — Telephone Encounter (Signed)
I have routed this request to Dr Dohmeier for review. The pt is due for the medication and [place] registry was verified.  

## 2017-11-30 ENCOUNTER — Telehealth: Payer: Self-pay | Admitting: Neurology

## 2017-11-30 NOTE — Telephone Encounter (Signed)
Pa submitted through cover my meds/opt[employer] rx.  KEY: [id number]: [id number] Should hear a response within 72 hours

## 2017-11-30 NOTE — Telephone Encounter (Signed)
Pa approved through opt[employer] Rx for the pt until 12/01/18. PA REF- [id number]

## 2018-01-11 ENCOUNTER — Ambulatory Visit: Payer: 59 | Admitting: Neurology

## 2018-03-09 ENCOUNTER — Other Ambulatory Visit: Payer: Self-pay | Admitting: Neurology

## 2018-03-09 ENCOUNTER — Telehealth: Payer: Self-pay | Admitting: Neurology

## 2018-03-09 MED ORDER — ADDERALL XR 20 MG PO CP24: ORAL_CAPSULE | ORAL | 0 refills | Status: DC

## 2018-03-09 NOTE — Telephone Encounter (Signed)
Pt requesting refills for amphetamine-dextroamphetamine (ADDERALL XR) 20 MG 24 hr capsule [facility]  °

## 2018-03-09 NOTE — Telephone Encounter (Signed)
I have routed this request to Dr Dohmeier for review. The pt is due for the medication and [place] registry was verified.  

## 2018-04-10 ENCOUNTER — Encounter: Payer: Self-pay | Admitting: Neurology

## 2018-04-14 ENCOUNTER — Other Ambulatory Visit: Payer: Self-pay | Admitting: Neurology

## 2018-04-14 ENCOUNTER — Encounter

## 2018-04-14 ENCOUNTER — Encounter: Payer: Self-pay | Admitting: Neurology

## 2018-04-14 ENCOUNTER — Ambulatory Visit: Payer: 59 | Admitting: Neurology

## 2018-04-14 VITALS — BP 122/78 | HR 82 | Ht 66.0 in | Wt 350.0 lb

## 2018-04-14 DIAGNOSIS — G4710 Hypersomnia, unspecified: Secondary | ICD-10-CM | POA: Diagnosis not present

## 2018-04-14 DIAGNOSIS — Z9989 Dependence on other enabling machines and devices: Secondary | ICD-10-CM | POA: Diagnosis not present

## 2018-04-14 DIAGNOSIS — G4733 Obstructive sleep apnea (adult) (pediatric): Secondary | ICD-10-CM

## 2018-04-14 MED ORDER — SOLRIAMFETOL HCL 150 MG PO TABS: ORAL_TABLET | ORAL | 1 refills | Status: AC

## 2018-04-14 MED ORDER — SOLRIAMFETOL HCL 75 MG PO TABS: 75.0000 mg | ORAL_TABLET | Freq: Every day | ORAL | 0 refills | Status: DC

## 2018-04-14 NOTE — Progress Notes (Signed)
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  Current BMI is 56.49.  She does change primary care providers and is now with Baldo Ash, NP  [place]. She has an extensive metabolic panel which showed that she had a high level of free testosterone, DHEA sulfate was very high at 294, TSH was in normal range there was no T3 or T4 yet checked, insulin was 31 which means that she is insulin resistant.  She does started on spironolactone and metformin which should support weight loss.   She endorsed the fatigue severity score of 59 or 63, the Epworth Sleepiness Scale at 12 points out of 24 possible points, her CPAP compliance is excellent 100% the CPAP is set at 11 cmH2O was 2 cm EPR, AHI is 2.0 which is an ideal resolution.  The median usage daily is 7 hours 52 minutes. She sleeps all Friday, Saturday and Sunday. She would like to try a new medication to help with wakefulness.      Karen Hendricks is a 37 year old female with a history of obstructive sleep apnea on CPAP, hypersomnolence and sleep disturbance.  She returns today for follow-up.   Her CPAP download indicates that she use her machine 30 out of 30 days for compliance of 100%.  She uses her machine greater than 4 hours each night.  On average she uses her machine 8 hours and 28 minutes.  Her residual AHI is 2.3 on 11 cm of water with EPR of 2.  She does not have a significant leak.  She continues to take Adderall with good benefit during the day.  She states that if she was able to lay down she would be able to fall asleep on it but she is not having trouble staying awake during the  day.  She states that she during the week she wakes up 3-4 times during the night.  On the weekends she wakes up more often since she is able to sleep longer.  She has tried Belsomra, xyrem, Celexa, Wellbutrin and melatonin without good benefit.  The patient states that she would like to be able to sleep all night long as she feels that she would feel more rested during the day.  She returns today for an evaluation.  HISTORY  09/10/2016, I am seeing Karen Hendricks today who has been 100% compliant CPAP user with an average user time of 8 hours and 46 minutes on 11 cm CPAP with a residual AHI of only 2.4 no major air leaks are noted. Her fatigue severity score remains very high at 63 her Epworth sleepiness score is 11. The patient is a participant in a idiopathic hypersomnia patient group. She learned about some trials of using medication such as clarithromycin and flumazenil cream. I certainly cannot find a reason not to try flumazenil cream as it is not systemically working. There has been a research group she informed me of at [place], [place], [place]. The trial is supposingly closed.   REVIEW OF SYSTEMS: Out of a complete 14 system review of symptoms, the patient complains only of the following symptoms, and all other reviewed systems are  negative.  See HPI  ALLERGIES: Allergies  Allergen Reactions  . Codeine Itching    REACTION: itch insomnia  . Doxycycline Swelling    Angioedema Lips swell    HOME MEDICATIONS: Outpatient Medications Prior to Visit  Medication Sig Dispense Refill  . ADDERALL XR 20 MG 24 hr capsule Take 1-2 as needed 60 capsule 0  . Melatonin 10 MG CAPS 5 to 10 mg one hour before intended bedtime. (Patient taking differently: 10 mg. 5 to 10 mg one hour before intended bedtime.) 30 capsule 0  . metFORMIN (GLUCOPHAGE) 500 MG tablet Take 500 mg by mouth daily.    Marland Kitchen. spironolactone (ALDACTONE) 50 MG tablet Take 50 mg by mouth daily.    Marland Kitchen. amphetamine-dextroamphetamine (ADDERALL  XR) 20 MG 24 hr capsule Brand Medically Necessary. Patient can take 1-2 capsules as needed. 60 capsule 0  . eszopiclone (LUNESTA) 2 MG TABS tablet Take 1 tablet (2 mg total) by mouth at bedtime as needed for sleep. Take immediately before bedtime 30 tablet 5   No facility-administered medications prior to visit.     PAST MEDICAL HISTORY: Past Medical History:  Diagnosis Date  . Hidradenitis suppurativa   . Hypersomnia   . Hypersomnia, persistent 04/17/2013   Persistent hypersomnia on CPAP, abnormal MSLT - XYREM controls hypersomnia now, remaining on CPAP.   . IUD mechanical complication 09/05/2013  . Narcolepsy   . OSA on CPAP   . Sinus infection   . Weight gain 03/2014   Cushing's??  Work up    PAST SURGICAL HISTORY: Past Surgical History:  Procedure Laterality Date  . adenoid surgery    . HYSTEROSCOPY N/A 09/07/2013   Procedure: HYSTEROSCOPY Removal of IUD arm;  Surgeon: Sherian ReinJody Bovard-Stuckert, MD;  Location: [facility];  Service: Gynecology;  Laterality: N/A;  . NASAL SINUS SURGERY  jan 2013  . TONSILLECTOMY      FAMILY HISTORY: Family History  Problem Relation Age of Onset  . Hypertension Mother   . Psoriasis Father     SOCIAL HISTORY: Social History   Socioeconomic History  . Marital status: Married    Spouse name: Jonny Ruizjohn  . Number of children: 1  . Years of education: 4012  . Highest education level: Not on file  Occupational History  . Occupation: Programmer, multimediadental hygienist    Employer: [employer]  Social Needs  . Financial resource strain: Not on file  . Food insecurity:    Worry: Not on file    Inability: Not on file  . Transportation needs:    Medical: Not on file    Non-medical: Not on file  Tobacco Use  . Smoking status: Current Every Day Smoker    Packs/day: 1.00    Years: 21.00    Pack years: 21.00    Types: Cigarettes  . Smokeless tobacco: Never Used  Substance and Sexual Activity  . Alcohol use: Yes    Alcohol/week: 0.0 standard drinks     Comment: rare  . Drug use: No  . Sexual activity: Not on file  Lifestyle  . Physical activity:    Days per week: Not on file    Minutes per session: Not on file  . Stress: Not on file  Relationships  . Social connections:    Talks on phone: Not on file    Gets together: Not on file    Attends religious service: Not on file    Active member of club or organization: Not on file    Attends  meetings of clubs or organizations: Not on file    Relationship status: Not on file  . Intimate partner violence:    Fear of current or ex partner: Not on file    Emotionally abused: Not on file    Physically abused: Not on file    Forced sexual activity: Not on file  Other Topics Concern  . Not on file  Social History Narrative  . Not on file      PHYSICAL EXAM  Vitals:   04/14/18 1033  BP: 122/78  Pulse: 82  Weight: (!) 350 lb (158.8 kg)  Height: 5\' 6"  (1.676 m)   Body mass index is 56.49 kg/m.  Generalized: Well developed, in no acute distress   Neurological examination  Mentation: Alert oriented to time, place, history taking. Follows all commands speech and language fluent Cranial nerve : Pupils were equal round reactive to light. Extraocular movements were full, visual field were full on confrontational test. Facial sensation and strength were normal. Uvula tongue midline. Head turning and shoulder shrug  were normal and symmetric. Motor: The motor testing reveals 5 over 5 strength of all 4 extremities. Good symmetric motor tone is noted throughout.  Sensory: Sensory testing is intact to soft touch on all 4 extremities. No evidence of extinction is noted.  Coordination: Cerebellar testing reveals good finger-nose-finger and heel-to-shin bilaterally.  Gait and station: Gait is normal. Tandem gait is normal. Romberg is negative. No drift is seen.  Reflexes: Deep tendon reflexes are symmetric and normal bilaterally.   DIAGNOSTIC DATA (LABS, IMAGING, TESTING) - I reviewed patient  records, labs, notes, testing and imaging myself where available.  Lab Results  Component Value Date   WBC 7.5 09/07/2013   HGB 14.3 09/07/2013   HCT 41.7 09/07/2013   MCV 94.3 09/07/2013   PLT 241 09/07/2013      ASSESSMENT AND PLAN 37 y.o. year old female  has a past medical history of Hidradenitis suppurativa, Hypersomnia, Hypersomnia, persistent (04/17/2013), IUD mechanical complication (09/05/2013), Narcolepsy, OSA on CPAP, Sinus infection, and Weight gain (03/2014). here with:  1.  Obstructive sleep apnea on CPAP, highly compliant  2.  Persistent Hypersomnolence 3.  Sleep disturbance, hyperosomnia  4.  Morbid Obesity.  The patient CPAP download shows excellent compliance and good treatment of her apnea.  She is encouraged to continue using her CPAP nightly.    She will start on [name], and stop Adderall in the meantime.  I have given her a coupon, a printed prescription for this medication, [name]. Rv in 6 month, after that yearly.    Melvyn Novasarmen Delenn Ahn, MD   04/14/2018, 10:39 AM [facility] [address], [address] [place], [place] [address] [phone]

## 2018-05-02 ENCOUNTER — Telehealth: Payer: Self-pay | Admitting: Neurology

## 2018-05-02 NOTE — Telephone Encounter (Signed)
PA completed for the patient on cover my meds/ optum RX.  [id number]

## 2018-05-02 NOTE — Telephone Encounter (Signed)
PA approved through opt[employer] RX Covered until 05/21/2019.  [id number]. Due to patient having tried and failed modafinil, xyrem, and adderal xr

## 2018-07-10 ENCOUNTER — Other Ambulatory Visit: Payer: Self-pay | Admitting: Neurology

## 2018-10-24 ENCOUNTER — Encounter: Payer: Self-pay | Admitting: Neurology

## 2018-10-24 ENCOUNTER — Telehealth: Payer: Self-pay | Admitting: Neurology

## 2018-10-24 ENCOUNTER — Other Ambulatory Visit: Payer: Self-pay

## 2018-10-24 ENCOUNTER — Ambulatory Visit: Payer: 59 | Admitting: Neurology

## 2018-10-24 VITALS — BP 128/78 | HR 94 | Temp 98.4°F | Ht 67.0 in | Wt 348.0 lb

## 2018-10-24 DIAGNOSIS — G4710 Hypersomnia, unspecified: Secondary | ICD-10-CM

## 2018-10-24 DIAGNOSIS — F17200 Nicotine dependence, unspecified, uncomplicated: Secondary | ICD-10-CM | POA: Diagnosis not present

## 2018-10-24 DIAGNOSIS — G4733 Obstructive sleep apnea (adult) (pediatric): Secondary | ICD-10-CM | POA: Diagnosis not present

## 2018-10-24 MED ORDER — SUNOSI 150 MG PO TABS: 150.0000 mg | ORAL_TABLET | ORAL | 5 refills | Status: DC

## 2018-10-24 NOTE — Progress Notes (Signed)
PATIENT: Karen Hendricks DOB: 07/20/1980  REASON FOR VISIT: follow up HISTORY FROM: patient  HISTORY OF PRESENT ILLNESS: RV ; interval history from  10/24/18, Karen Hendricks is seen here today, a meanwhile 38 year old caucasian female with OSA, tobacco use, persistent hypersomnia. She was diagnosed with ideopathic hypersomnia and OSA- she is usig now [place] with good success.  The patient presents today with an Epworth sleepiness score endorsing 8 out of 24 points, but still remains struggling with significant fatigue.  Fatigue severity score was 61 out of 61 points.  Her CPAP compliance is excellent as it has been all these years before she has been using the machine 100% of days and 100% of the recommended time with an average user time of 8 hours and 26 minutes, CPAP is an AutoSet set to 11 cmH2O was 2 cm expiratory pressure relief, the residual AHI is 2.4 which is excellent, no central apneas are emerging.  She has minimal air leakage.  I do not think he could improve on the apnea formed in any way, the sleepiness has benefited from Karen Hendricks, she still has sometimes trouble to fall asleep at night. It is actually more the inability to sustain sleep and stay asleep. She has been on HTN medication.   Refilling [name] today. She has not taken Adderall for several month now- had a " reserve ' at home.       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  Current BMI is 56.49.  She does change primary care providers and is now with Karen AshMichael Martin, NP  [place] [facility]. She has an extensive metabolic panel which showed that she had a high level of free testosterone, DHEA sulfate was very high at 294, TSH was in normal range there was no T3 or T4 yet checked, insulin was 31 which means  that she is insulin resistant.  She does started on spironolactone and metformin which should support weight loss.   She endorsed the fatigue severity score of 59 or 63, the Epworth Sleepiness Scale at 12 points out of 24 possible points, her CPAP compliance is excellent 100% the CPAP is set at 11 cmH2O was 2 cm EPR, AHI is 2.0 which is an ideal resolution.  The median usage daily is 7 hours 52 minutes. She sleeps all Friday, Saturday and Sunday. She would like to try a new medication to help with wakefulness.      Karen Hendricks is a 38 year old female with a history of obstructive sleep apnea on CPAP, hypersomnolence and sleep disturbance.  She returns today for follow-up.   Her CPAP download indicates that she use her machine 30 out of 30 days for compliance of 100%.  She uses her machine greater than 4 hours each night.  On average she uses her machine 8 hours and 28 minutes.  Her residual AHI is 2.3 on 11 cm of water with EPR of 2.  She does not have a significant leak.  She continues to take Adderall with good benefit during the day.  She states that if she was able to lay down she would be able to fall asleep on it but she is not having trouble staying awake during the day.  She states that she during the  week she wakes up 3-4 times during the night.  On the weekends she wakes up more often since she is able to sleep longer.  She has tried Belsomra, xyrem, Celexa, Wellbutrin and melatonin without good benefit.  The patient states that she would like to be able to sleep all night long as she feels that she would feel more rested during the day.  She returns today for an evaluation.  HISTORY  09/10/2016, I am seeing [name] today who has been 100% compliant CPAP user with an average user time of 8 hours and 46 minutes on 11 cm CPAP with a residual AHI of only 2.4 no major air leaks are noted. Her fatigue severity score remains very high at 63 her Epworth sleepiness score is 11. The patient is a  participant in a idiopathic hypersomnia patient group. She learned about some trials of using medication such as clarithromycin and flumazenil cream. I certainly cannot find a reason not to try flumazenil cream as it is not systemically working. There has been a research group she informed me of at [place], [place], [place]. The trial is supposingly closed.   REVIEW OF SYSTEMS: Out of a complete 14 system review of symptoms, the patient complains only of the following symptoms, and all other reviewed systems are negative.  How likely are you to doze in the following situations: 0 = not likely, 1 = slight chance, 2 = moderate chance, 3 = high chance  Sitting and Reading? Watching Television? Sitting inactive in a public place (theater or meeting)? Lying down in the afternoon when circumstances permit? Sitting and talking to someone? Sitting quietly after lunch without alcohol? In a car, while stopped for a few minutes in traffic? As a passenger in a car for an hour without a break?  Total = 8/ 24   On 10-24-2018   ALLERGIES: Allergies  Allergen Reactions  . Codeine Itching    REACTION: itch insomnia  . Doxycycline Swelling    Angioedema Lips swell    HOME MEDICATIONS: Outpatient Medications Prior to Visit  Medication Sig Dispense Refill  . Melatonin 10 MG CAPS 5 to 10 mg one hour before intended bedtime. (Patient taking differently: 10 mg. 5 to 10 mg one hour before intended bedtime.) 30 capsule 0  . metFORMIN (GLUCOPHAGE) 500 MG tablet Take 500 mg by mouth daily.    . metoprolol succinate (TOPROL-XL) 50 MG 24 hr tablet Take 50 mg by mouth daily.    . rosuvastatin (CRESTOR) 10 MG tablet TAKE 1 TABLET BY MOUTH EVERY DAY AT NIGHT    . Solriamfetol HCl ([name]) 150 MG TABS Take 150 mg by mouth every morning. 30 tablet 5  . spironolactone (ALDACTONE) 50 MG tablet Take 50 mg by mouth daily.    . ADDERALL XR 20 MG 24 hr capsule Take 1-2 as needed 60 capsule 0  . Solriamfetol HCl  ([name]) 75 MG TABS Take 75 mg by mouth daily. 14 tablet 0   No facility-administered medications prior to visit.     PAST MEDICAL HISTORY: Past Medical History:  Diagnosis Date  . Hidradenitis suppurativa   . Hypersomnia   . Hypersomnia, persistent 04/17/2013   Persistent hypersomnia on CPAP, abnormal MSLT - XYREM controls hypersomnia now, remaining on CPAP.   . IUD mechanical complication 09/05/2013  . Narcolepsy   . OSA on CPAP   . Sinus infection   . Weight gain 03/2014   Cushing's??  Work up    PAST SURGICAL HISTORY: Past  Surgical History:  Procedure Laterality Date  . adenoid surgery    . HYSTEROSCOPY N/A 09/07/2013   Procedure: HYSTEROSCOPY Removal of IUD arm;  Surgeon: Sherian ReinJody Bovard-Stuckert, MD;  Location: [facility];  Service: Gynecology;  Laterality: N/A;  . NASAL SINUS SURGERY  jan 2013  . TONSILLECTOMY      FAMILY HISTORY: Family History  Problem Relation Age of Onset  . Hypertension Mother   . Psoriasis Father     SOCIAL HISTORY: Social History   Socioeconomic History  . Marital status: Married    Spouse name: Jonny Ruizjohn  . Number of children: 1  . Years of education: 1312  . Highest education level: Not on file  Occupational History  . Occupation: Programmer, multimediadental hygienist    Employer: [employer]  Social Needs  . Financial resource strain: Not on file  . Food insecurity    Worry: Not on file    Inability: Not on file  . Transportation needs    Medical: Not on file    Non-medical: Not on file  Tobacco Use  . Smoking status: Current Every Day Smoker    Packs/day: 1.00    Years: 21.00    Pack years: 21.00    Types: Cigarettes  . Smokeless tobacco: Never Used  Substance and Sexual Activity  . Alcohol use: Yes    Alcohol/week: 0.0 standard drinks    Comment: rare  . Drug use: No  . Sexual activity: Not on file  Lifestyle  . Physical activity    Days per week: Not on file    Minutes per session: Not on file  . Stress: Not on file   Relationships  . Social Musicianconnections    Talks on phone: Not on file    Gets together: Not on file    Attends religious service: Not on file    Active member of club or organization: Not on file    Attends meetings of clubs or organizations: Not on file    Relationship status: Not on file  . Intimate partner violence    Fear of current or ex partner: Not on file    Emotionally abused: Not on file    Physically abused: Not on file    Forced sexual activity: Not on file  Other Topics Concern  . Not on file  Social History Narrative  . Not on file      PHYSICAL EXAM  Vitals:   10/24/18 1035  BP: 128/78  Pulse: 94  Temp: 98.4 F (36.9 C)  Weight: (!) 348 lb (157.9 kg)  Height: 5\' 7"  (1.702 m)   Body mass index is 54.5 kg/m.  Generalized: Well developed, in no acute distress   Neurological examination  Mentation: Alert oriented to time, place, history taking.  Follows all commands speech and language fluent Cranial nerve : Pupils were equally round reactive to light. Uvula tongue midline. Head turning and shoulder shrug  were normal and symmetric. Motor: Tsymmetric motor tone is noted throughout.  Sensory: Sensory testing is intact to soft touch on all 4 extremities. No evidence of extinction is noted.  Coordination: no changing in penman ship. Gait and station: Gait is normal. Tandem gait is normal. Romberg is negative. No drift is seen.  Reflexes: Deep tendon reflexes are symmetric and normal bilaterally.   DIAGNOSTIC DATA (LABS, IMAGING, TESTING) - I reviewed patient records, labs, notes, testing and imaging myself where available.  Lab Results  Component Value Date   WBC 7.5 09/07/2013  HGB 14.3 09/07/2013   HCT 41.7 09/07/2013   MCV 94.3 09/07/2013   PLT 241 09/07/2013      ASSESSMENT AND PLAN 38 y.o. year old female  has a past medical history of Hidradenitis suppurativa, Hypersomnia, Hypersomnia, persistent (04/17/2013), IUD mechanical complication  (07/27/1446), Narcolepsy, OSA on CPAP, Sinus infection, and Weight gain (03/2014). here with:  1.  Obstructive sleep apnea on CPAP, highly compliant 100%  2.  Persistent Hypersomnolence has resolved on [name].  3.  Sleep disturbance, hyperosomnia  4.  Morbid Obesity.  The patient CPAP download shows excellent compliance and good treatment of her apnea.  She is encouraged to continue using her CPAP nightly.    Refilled  [name]. Rv in 6 month, after that yearly.    Larey Seat, MD   10/24/2018, 10:55 AM [facility] [address], [place], [place] [address] [phone]

## 2018-10-24 NOTE — Telephone Encounter (Signed)
Patient checked out after her appointment today and I started to schedule her 6 month follow-up per Dr. Brett Fairy. Patient stated that she would like to wait to schedule until she looks at her work calendar. Patient states that she will call back to schedule.

## 2018-10-24 NOTE — Patient Instructions (Signed)

## 2018-10-25 ENCOUNTER — Telehealth: Payer: Self-pay | Admitting: Neurology

## 2018-10-25 LAB — COMPREHENSIVE METABOLIC PANEL
ALT: 21 IU/L (ref 0–32)
AST: 19 IU/L (ref 0–40)
Albumin/Globulin Ratio: 1.8 (ref 1.2–2.2)
Albumin: 4.4 g/dL (ref 3.8–4.8)
Alkaline Phosphatase: 69 IU/L (ref 39–117)
BUN/Creatinine Ratio: 13 (ref 9–23)
BUN: 10 mg/dL (ref 6–24)
Bilirubin Total: 0.2 mg/dL (ref 0.0–1.2)
CO2: 21 mmol/L (ref 20–29)
Calcium: 9.7 mg/dL (ref 8.7–10.2)
Chloride: 105 mmol/L (ref 96–106)
Creatinine, Ser: 0.78 mg/dL (ref 0.57–1.00)
GFR calc Af Amer: 110 mL/min/{1.73_m2} (ref >59)
GFR calc non Af Amer: 95 mL/min/{1.73_m2} (ref >59)
Globulin, Total: 2.4 g/dL (ref 1.5–4.5)
Glucose: 90 mg/dL (ref 65–99)
Potassium: 5 mmol/L (ref 3.5–5.2)
Sodium: 141 mmol/L (ref 134–144)
Total Protein: 6.8 g/dL (ref 6.0–8.5)

## 2018-10-25 NOTE — Telephone Encounter (Signed)
-----   Message from Larey Seat, MD sent at 10/25/2018  9:35 AM EDT ----- Normal CMET, continue [name]

## 2018-10-25 NOTE — Telephone Encounter (Signed)
Called the pt to make her aware that lab work was normal. There was no answer. LVM informing the pt of the normal lab work. Advised in VM for the pt to call back with any questions.

## 2018-12-21 ENCOUNTER — Telehealth: Payer: Self-pay | Admitting: Neurology

## 2018-12-21 ENCOUNTER — Telehealth: Payer: Self-pay

## 2018-12-21 ENCOUNTER — Other Ambulatory Visit: Payer: Self-pay

## 2018-12-21 DIAGNOSIS — G4733 Obstructive sleep apnea (adult) (pediatric): Secondary | ICD-10-CM

## 2018-12-21 NOTE — Telephone Encounter (Signed)
Pt has called to inform that her CPAP is making a racket noise.  Pt states it has done that before and she had to get a replacement.  Pt states she reached out to [facility] and was told an order would need to be faxed to them at [phone]

## 2018-12-21 NOTE — Telephone Encounter (Signed)
IF pt calls back please schedule her with Dr .Brett Fairy for a 6 month appt in January 2021 for sleep apnea.

## 2018-12-21 NOTE — Telephone Encounter (Signed)
If patient calls back please schedule for a 6 month follow up with Dr. Brett Fairy in January 2021 per her last note.  LEft vm for pt she needs a 6 month follow up schedule for January 2021. LEft vm for pt that new cipap order was sent to [facility] today. I stated to give them a call if she has any questions.

## 2018-12-21 NOTE — Telephone Encounter (Signed)
PT needs a 6 month follow up.

## 2019-01-15 ENCOUNTER — Other Ambulatory Visit: Payer: Self-pay | Admitting: Neurology

## 2019-06-13 ENCOUNTER — Telehealth: Payer: Self-pay | Admitting: Neurology

## 2019-06-13 NOTE — Telephone Encounter (Signed)
PA completed through cover my meds/[employer] [id number]. Will wait for reply

## 2019-06-15 NOTE — Telephone Encounter (Signed)
PA approved the sunosi until 06/12/2020. Through [employer] [phone]

## 2019-06-26 ENCOUNTER — Encounter: Payer: Self-pay | Admitting: Neurology

## 2019-06-29 ENCOUNTER — Ambulatory Visit: Payer: 59 | Admitting: Neurology

## 2019-06-29 ENCOUNTER — Other Ambulatory Visit: Payer: Self-pay

## 2019-06-29 ENCOUNTER — Encounter: Payer: Self-pay | Admitting: Neurology

## 2019-06-29 DIAGNOSIS — G4710 Hypersomnia, unspecified: Secondary | ICD-10-CM

## 2019-06-29 DIAGNOSIS — G4733 Obstructive sleep apnea (adult) (pediatric): Secondary | ICD-10-CM

## 2019-06-29 MED ORDER — BUPROPION HCL ER (XL) 150 MG PO TB24: 150.0000 mg | ORAL_TABLET | Freq: Every day | ORAL | 5 refills | Status: DC

## 2019-06-29 MED ORDER — SUNOSI 150 MG PO TABS: 150.0000 mg | ORAL_TABLET | Freq: Every day | ORAL | 5 refills | Status: DC

## 2019-06-29 NOTE — Patient Instructions (Signed)
Solriamfetol tablets What is this medicine? SOLRIAMFETOL (sol ri AM fe tol) is used to treat excessive sleepiness caused by certain sleep disorders including narcolepsy and obstructive sleep apnea. This medicine may be used for other purposes; ask your health care provider or pharmacist if you have questions. COMMON BRAND NAME(S): [name] What should I tell my health care provider before I take this medicine? They need to know if you have any of these conditions:  bipolar disorder  diabetes  heart disease  high blood pressure  high cholesterol  history of drug abuse or alcohol abuse problem  history of stroke  kidney disease  schizophrenia  an unusual or allergic reaction to solriamfetol, other medicines, foods, dyes, or preservatives  pregnant or trying to get pregnant  breast-feeding How should I use this medicine? Take this medicine by mouth with a glass of water when you first wake up. Do not take it within 9 hours of your planned bedtime. Follow the directions on the prescription label. You can take it with or without food. If it upsets your stomach, take it with food. Take your medicine at regular intervals. Do not take it more often than directed. Do not stop taking except on your doctor's advice. A special MedGuide will be given to you by the pharmacist with each prescription and refill. Be sure to read this information carefully each time. Talk to your pediatrician regarding the use of this medicine in children. Special care may be needed. Overdosage: If you think you have taken too much of this medicine contact a poison control center or emergency room at once. NOTE: This medicine is only for you. Do not share this medicine with others. What if I miss a dose? If you miss a dose, take it as soon as you can. However, avoid taking it within 9 hours of your planned bedtime, since you may find it harder to go to sleep. If it is almost time for your next dose, take only that  dose. Do not take double or extra doses. What may interact with this medicine? Do not take this medicine with any of the following medications:  MAOIs like Carbex, Eldepryl, Marplan, Nardil, and Parnate This medicine may also interact with the following medications:  certain medicines for Parkinson's disease like levodopa, pramipexole, or ropinirole  medicines that increase blood pressure or heart rate This list may not describe all possible interactions. Give your health care provider a list of all the medicines, herbs, non-prescription drugs, or dietary supplements you use. Also tell them if you smoke, drink alcohol, or use illegal drugs. Some items may interact with your medicine. What should I watch for while using this medicine? Visit your healthcare professional for regular checks on your progress. Tell your healthcare professional if your symptoms do not start to get better or if they get worse. This medicine has a risk of abuse and dependence. Your healthcare provider will check you for this while you take this medicine. What side effects may I notice from receiving this medicine? Side effects that you should report to your doctor or health care professional as soon as possible:  allergic reactions like skin rash, itching, and hives; swelling of the face, lips, or tongue  anxiety  changes in emotions or moods  elevated mood, decreased need for sleep, racing thoughts, impulsive behavior  fast heartbeat  hallucinations, loss of contact with reality  irritable  signs and symptoms of a dangerous increase in blood pressure like chest pain; shortness of breath;   sudden severe headache; vision disturbances; seizures; decreased consciousness  signs and symptoms of a stroke like changes in vision; confusion; trouble speaking or understanding; severe headaches; sudden numbness or weakness of the face, arm or leg; trouble walking; dizziness; loss of balance or  coordination  vomiting Side effects that usually do not require medical attention (report these to your doctor or health care professional if they continue or are bothersome):  decreased appetite  dry mouth  increased sweating  nausea  trouble sleeping This list may not describe all possible side effects. Call your doctor for medical advice about side effects. You may report side effects to FDA at 1-800-FDA-1088. Where should I keep my medicine? Keep out of the reach of children. This medicine can be abused. Keep your medicine in a safe place to protect it from theft. Do not share this medicine with anyone. Selling or giving away this medicine is dangerous and is against the law. Store at room temperature between 15 and 30 degrees C (59 and 86 degrees F). This medicine may cause harm and death if it is taken by other adults, children, or pets. Return medicine that has not been used to an official disposal site. Contact the DEA at (908) 104-9841 or your city/county government to find a site. If you cannot return the medicine, mix any unused medicine with a substance like cat litter or coffee grounds. Then throw the medicine away in a sealed container like a sealed bag or coffee can with a lid. Do not use the medicine after the expiration date. NOTE: This sheet is a summary. It may not cover all possible information. If you have questions about this medicine, talk to your doctor, pharmacist, or health care provider.  2020 Elsevier/Gold Standard (2018-01-20 12:40:49) Bupropion tablets (Depression/Mood Disorders) What is this medicine? BUPROPION (byoo PROE pee on) is used to treat depression. This medicine may be used for other purposes; ask your health care provider or pharmacist if you have questions. COMMON BRAND NAME(S): Wellbutrin What should I tell my health care provider before I take this medicine? They need to know if you have any of these conditions:  an eating disorder, such as  anorexia or bulimia  bipolar disorder or psychosis  diabetes or high blood sugar, treated with medication  glaucoma  heart disease, previous heart attack, or irregular heart beat  head injury or brain tumor  high blood pressure  kidney or liver disease  seizures  suicidal thoughts or a previous suicide attempt  Tourette's syndrome  weight loss  an unusual or allergic reaction to bupropion, other medicines, foods, dyes, or preservatives  breast-feeding  pregnant or trying to become pregnant How should I use this medicine? Take this medicine by mouth with a glass of water. Follow the directions on the prescription label. You can take it with or without food. If it upsets your stomach, take it with food. Take your medicine at regular intervals. Do not take your medicine more often than directed. Do not stop taking this medicine suddenly except upon the advice of your doctor. Stopping this medicine too quickly may cause serious side effects or your condition may worsen. A special MedGuide will be given to you by the pharmacist with each prescription and refill. Be sure to read this information carefully each time. Talk to your pediatrician regarding the use of this medicine in children. Special care may be needed. Overdosage: If you think you have taken too much of this medicine contact a poison control center or  emergency room at once. NOTE: This medicine is only for you. Do not share this medicine with others. What if I miss a dose? If you miss a dose, take it as soon as you can. If it is less than four hours to your next dose, take only that dose and skip the missed dose. Do not take double or extra doses. What may interact with this medicine? Do not take this medicine with any of the following medications:  linezolid  MAOIs like Azilect, Carbex, Eldepryl, Marplan, Nardil, and Parnate  methylene blue (injected into a vein)  other medicines that contain bupropion like  Zyban This medicine may also interact with the following medications:  alcohol  certain medicines for anxiety or sleep  certain medicines for blood pressure like metoprolol, propranolol  certain medicines for depression or psychotic disturbances  certain medicines for HIV or AIDS like efavirenz, lopinavir, nelfinavir, ritonavir  certain medicines for irregular heart beat like propafenone, flecainide  certain medicines for Parkinson's disease like amantadine, levodopa  certain medicines for seizures like carbamazepine, phenytoin, phenobarbital  cimetidine  clopidogrel  cyclophosphamide  digoxin  furazolidone  isoniazid  nicotine  orphenadrine  procarbazine  steroid medicines like prednisone or cortisone  stimulant medicines for attention disorders, weight loss, or to stay awake  tamoxifen  theophylline  thiotepa  ticlopidine  tramadol  warfarin This list may not describe all possible interactions. Give your health care provider a list of all the medicines, herbs, non-prescription drugs, or dietary supplements you use. Also tell them if you smoke, drink alcohol, or use illegal drugs. Some items may interact with your medicine. What should I watch for while using this medicine? Tell your doctor if your symptoms do not get better or if they get worse. Visit your doctor or healthcare provider for regular checks on your progress. Because it may take several weeks to see the full effects of this medicine, it is important to continue your treatment as prescribed by your doctor. This medicine may cause serious skin reactions. They can happen weeks to months after starting the medicine. Contact your healthcare provider right away if you notice fevers or flu-like symptoms with a rash. The rash may be red or purple and then turn into blisters or peeling of the skin. Or, you might notice a red rash with swelling of the face, lips or lymph nodes in your neck or under your  arms. Patients and their families should watch out for new or worsening thoughts of suicide or depression. Also watch out for sudden changes in feelings such as feeling anxious, agitated, panicky, irritable, hostile, aggressive, impulsive, severely restless, overly excited and hyperactive, or not being able to sleep. If this happens, especially at the beginning of treatment or after a change in dose, call your healthcare provider. Avoid alcoholic drinks while taking this medicine. Drinking excessive alcoholic beverages, using sleeping or anxiety medicines, or quickly stopping the use of these agents while taking this medicine may increase your risk for a seizure. Do not drive or use heavy machinery until you know how this medicine affects you. This medicine can impair your ability to perform these tasks. Do not take this medicine close to bedtime. It may prevent you from sleeping. Your mouth may get dry. Chewing sugarless gum or sucking hard candy, and drinking plenty of water may help. Contact your doctor if the problem does not go away or is severe. What side effects may I notice from receiving this medicine? Side effects that you should  report to your doctor or health care professional as soon as possible:  allergic reactions like skin rash, itching or hives, swelling of the face, lips, or tongue  breathing problems  changes in vision  confusion  elevated mood, decreased need for sleep, racing thoughts, impulsive behavior  fast or irregular heartbeat  hallucinations, loss of contact with reality  increased blood pressure  rash, fever, and swollen lymph nodes  redness, blistering, peeling, or loosening of the skin, including inside the mouth  seizures  suicidal thoughts or other mood changes  unusually weak or tired  vomiting Side effects that usually do not require medical attention (report to your doctor or health care professional if they continue or are  bothersome):  constipation  headache  loss of appetite  nausea  tremors  weight loss This list may not describe all possible side effects. Call your doctor for medical advice about side effects. You may report side effects to FDA at 1-800-FDA-1088. Where should I keep my medicine? Keep out of the reach of children. Store at room temperature between 20 and 25 degrees C (68 and 77 degrees F), away from direct sunlight and moisture. Keep tightly closed. Throw away any unused medicine after the expiration date. NOTE: This sheet is a summary. It may not cover all possible information. If you have questions about this medicine, talk to your doctor, pharmacist, or health care provider.  2020 Elsevier/Gold Standard (2018-06-30 14:02:47)

## 2019-06-29 NOTE — Progress Notes (Signed)
PATIENT: Karen Hendricks DOB: July 20, 1980  REASON FOR VISIT: follow up HISTORY FROM: patient  HISTORY OF PRESENT ILLNESS: RV ; interval history from  06/29/19, I am seeing Karen Hendricks today, I mean by 39 year old Caucasian right-handed female with a history of idiopathic hypersomnia and obstructive sleep apnea who started using [name] with good success to treat sleepiness.  She is also a CPAP user.  She is using CPAP with an AutoSet between 11 cmH2O and 2 cm EPR setting 100% compliance for the last 30 days with an average use at time of 8 hours and 16 minutes.  The residual AHI is 2.0 and there are no central apneas emerging.  Air leaks are very minor but the 95th percentile leak strengths at 4.5 L/min.  This is a lady still reports being very fatigued and this is reflected in the endorsement of 61 points out of 63 possible points on the fatigue severity scale but her Epworth sleepiness score has decreased with the use of [name].  She endorsed this at 9 out of 24 points.  Without additional treatment the patient had severe excessive daytime sleepiness but being compliant with CPAP. Main risk factor for apnea is BMI of 52, and her smoking habit.         Karen Hendricks is seen here today, a meanwhile 39 year old caucasian female with OSA, tobacco use, persistent hypersomnia. She was diagnosed with ideopathic hypersomnia and OSA- she is usig now [place] with good success.  The patient presents today with an Epworth sleepiness score endorsing 8 out of 24 points, but still remains struggling with significant fatigue.  Fatigue severity score was 61 out of 61 points.  Her CPAP compliance is excellent as it has been all these years before she has been using the machine 100% of days and 100% of the recommended time with an average user time of 8 hours and 26 minutes, CPAP is an AutoSet set to 11 cmH2O was 2 cm expiratory pressure relief, the residual AHI is 2.4 which is excellent, no central apneas are  emerging.  She has minimal air leakage.  I do not think he could improve on the apnea formed in any way, the sleepiness has benefited from [facility], she still has sometimes trouble to fall asleep at night. It is actually more the inability to sustain sleep and stay asleep. She has been on HTN medication.   Refilling [name] today. She has not taken Adderall for several month now- had a " reserve ' at home.       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  Current BMI is 56.49.  She does change primary care providers and is now with Baldo Ash, NP  [facility]. She has an extensive metabolic panel which showed that she had a high level of free testosterone, DHEA sulfate was very high at 294, TSH was in normal range there was no T3 or T4 yet checked, insulin was 31 which means that she is insulin resistant.  She does started on spironolactone and metformin which should support weight loss.   She endorsed the fatigue severity score of 59 or 63, the Epworth Sleepiness Scale at 12 points out of 24 possible points, her CPAP compliance is excellent 100% the  CPAP is set at 11 cmH2O was 2 cm EPR, AHI is 2.0 which is an ideal resolution.  The median usage daily is 7 hours 52 minutes. She sleeps all Friday, Saturday and Sunday. She would like to try a new medication to help with wakefulness.      Karen Hendricks is a 39 year old female with a history of obstructive sleep apnea on CPAP, hypersomnolence and sleep disturbance.  She returns today for follow-up.   Her CPAP download indicates that she use her machine 30 out of 30 days for compliance of 100%.  She uses her machine greater than 4 hours each night.  On average she uses her machine 8 hours and 28 minutes.  Her residual AHI is 2.3 on 11  cm of water with EPR of 2.  She does not have a significant leak.  She continues to take Adderall with good benefit during the day.  She states that if she was able to lay down she would be able to fall asleep on it but she is not having trouble staying awake during the day.  She states that she during the week she wakes up 3-4 times during the night.  On the weekends she wakes up more often since she is able to sleep longer.  She has tried Belsomra, xyrem, Celexa, Wellbutrin and melatonin without good benefit.  The patient states that she would like to be able to sleep all night long as she feels that she would feel more rested during the day.  She returns today for an evaluation.  HISTORY  09/10/2016, I am seeing Karen Hendricks today who has been 100% compliant CPAP user with an average user time of 8 hours and 46 minutes on 11 cm CPAP with a residual AHI of only 2.4 no major air leaks are noted. Her fatigue severity score remains very high at 63 her Epworth sleepiness score is 11. The patient is a participant in a idiopathic hypersomnia patient group. She learned about some trials of using medication such as clarithromycin and flumazenil cream. I certainly cannot find a reason not to try flumazenil cream as it is not systemically working. There has been a research group she informed me of at [place], [place], [place]. The trial is supposingly closed.   REVIEW OF SYSTEMS: Out of a complete 14 system review of symptoms, the patient complains only of the following symptoms, and all other reviewed systems are negative.  How likely are you to doze in the following situations: 0 = not likely, 1 = slight chance, 2 = moderate chance, 3 = high chance  Sitting and Reading? 2 Watching Television? 2 Sitting inactive in a public place (theater or meeting)?0 Lying down in the afternoon when circumstances permit? 2 Sitting and talking to someone?0 Sitting quietly after lunch without alcohol?0 In a car, while stopped  for a few minutes in traffic?0 As a passenger in a car for an hour without a break?2  Total = 9/ 24   06-29-2019.    ALLERGIES: Allergies  Allergen Reactions  . Codeine Itching    REACTION: itch insomnia  . Doxycycline Swelling    Angioedema Lips swell    HOME MEDICATIONS: Outpatient Medications Prior to Visit  Medication Sig Dispense Refill  . Melatonin 10 MG CAPS 5 to 10 mg one hour before intended bedtime. (Patient taking differently: 10 mg. 5 to 10 mg one hour before intended bedtime.) 30 capsule 0  . metFORMIN (GLUCOPHAGE) 500 MG tablet Take 1,000 mg by mouth  daily.     . metoprolol succinate (TOPROL-XL) 50 MG 24 hr tablet Take 50 mg by mouth daily.    . rosuvastatin (CRESTOR) 10 MG tablet TAKE 1 TABLET BY MOUTH EVERY DAY AT NIGHT    . Semaglutide (OZEMPIC, 0.25 OR 0.5 MG/DOSE, [place]) Inject 0.5 mg into the skin once a week.    . spironolactone (ALDACTONE) 100 MG tablet Take 100 mg by mouth daily.     . [name] 150 MG TABS TABLET BY MOUTH IN THE MORNING 30 tablet 5   No facility-administered medications prior to visit.    PAST MEDICAL HISTORY: Past Medical History:  Diagnosis Date  . Hidradenitis suppurativa   . Hypersomnia   . Hypersomnia, persistent 04/17/2013   Persistent hypersomnia on CPAP, abnormal MSLT - XYREM controls hypersomnia now, remaining on CPAP.   . IUD mechanical complication 1/63/8453  . Narcolepsy   . OSA on CPAP   . Sinus infection   . Weight gain 03/2014   Cushing's??  Work up    PAST SURGICAL HISTORY: Past Surgical History:  Procedure Laterality Date  . adenoid surgery    . HYSTEROSCOPY N/A 09/07/2013   Procedure: HYSTEROSCOPY Removal of IUD arm;  Surgeon: Janyth Contes, MD;  Location: [facility];  Service: Gynecology;  Laterality: N/A;  . NASAL SINUS SURGERY  jan 2013  . TONSILLECTOMY      FAMILY HISTORY: Family History  Problem Relation Age of Onset  . Hypertension Mother   . Psoriasis Father     SOCIAL HISTORY: Social History     Socioeconomic History  . Marital status: Married    Spouse name: Jenny Reichmann  . Number of children: 1  . Years of education: 75  . Highest education level: Not on file  Occupational History  . 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 [employer] D[employer]  Tobacco Use  . Smoking status: Current Every Day Smoker    Packs/day: 1.00    Years: 21.00    Pack years: 21.00    Types: Cigarettes  . Smokeless tobacco: Never Used  Substance and Sexual Activity  . Alcohol use: Yes    Alcohol/week: 0.0 standard drinks    Comment: rare  . Drug use: No  . Sexual activity: Not on file  Other Topics Concern  . Not on file  Social History Narrative  . Not on file   Social Determinants of Health   Financial Resource Strain:   . Difficulty of Paying Living Expenses:   Food Insecurity:   . Worried About Charity fundraiser in the Last Year:   . Arboriculturist in the Last Year:   Transportation Needs:   . Film/video editor (Medical):   Marland Kitchen Lack of Transportation (Non-Medical):   Physical Activity:   . Days of Exercise per Week:   . Minutes of Exercise per Session:   Stress:   . Feeling of Stress :   Social Connections:   . Frequency of Communication with Friends and Family:   . Frequency of Social Gatherings with Friends and Family:   . Attends Religious Services:   . Active Member of Clubs or Organizations:   . Attends Archivist Meetings:   Marland Kitchen Marital Status:   Intimate Partner Violence:   . Fear of Current or Ex-Partner:   . Emotionally Abused:   Marland Kitchen Physically Abused:   . Sexually Abused:       PHYSICAL EXAM  Vitals:   06/29/19 1037  BP: Marland Kitchen)  128/92  Pulse: 91  Temp: 97.7 F (36.5 C)  Weight: (!) 333 lb (151 kg)  Height: 5\' 7"  (1.702 m)   Body mass index is 52.16 kg/m.  Generalized: Well developed, in no acute distress.  no ankle edema, no intention tremor.   Neurological examination  Mentation: Alert oriented to time, place, history taking. In  good mood, cooperative.  Follows all commands speech and language fluent. Cranial nerve : Pupils were equally round and promptly  reactive to light and accomodation.   Uvula and tongue move in midline. Head turning and shoulder shrug were normal and symmetric. Motor: Symmetric motor tone is noted throughout. Equal bulk, equal ROM on upper extremities.  Sensory: Sensory testing is intact to soft touch on all 4 extremities.  No evidence of extinction is noted.  Coordination: no changing in penman ship. Her work as a is requiring a steady hand.   Gait and station: Gait is of wider base -Tandem gait is normal. No drift is seen.  Reflexes: Deep tendon reflexes are symmetricbilaterally.   DIAGNOSTIC DATA (LABS, IMAGING, TESTING) - I reviewed patient records, labs, notes, testing and imaging myself where available.  Lab Results  Component Value Date   WBC 7.5 09/07/2013   HGB 14.3 09/07/2013   HCT 41.7 09/07/2013   MCV 94.3 09/07/2013   PLT 241 09/07/2013      ASSESSMENT AND PLAN 39 y.o. year old female  has a past medical history of Hidradenitis suppurativa, Hypersomnia, Hypersomnia, persistent (04/17/2013), IUD mechanical complication (09/05/2013), Narcolepsy, OSA on CPAP, Sinus infection, and Weight gain (03/2014). seen here for 15 minutes with:  1.  Obstructive sleep apnea on CPAP, highly compliant 100%  . Loves the nasal pillow mask !  2.  Persistent Hypersomnolence has resolved on [name]. Refilled today. 3.  Morbid Obesity.  The patient CPAP download shows excellent compliance and good treatment of her apnea. She is encouraged to continue using her CPAP nightly.    Refilled  [name]. Rv in 6 month, after that yearly.    04/2014, MD   06/29/2019, 11:06 AM [facility] [address], [address] [place], [place] [phone]

## 2019-06-30 ENCOUNTER — Ambulatory Visit: Payer: 59 | Attending: Internal Medicine

## 2019-06-30 DIAGNOSIS — Z23 Encounter for immunization: Secondary | ICD-10-CM

## 2019-06-30 NOTE — Progress Notes (Signed)
   Covid-19 Vaccination Clinic  Name:  Karen Hendricks    MRN: [id number] DOB: 26-May-1980  06/30/2019  [name] was observed post Covid-19 immunization for 15 minutes without incident. She was provided with Vaccine Information Sheet and instruction to access the V-Safe system.   [name] was instructed to call 911 with any severe reactions post vaccine: Marland Kitchen Difficulty breathing  . Swelling of face and throat  . A fast heartbeat  . A bad rash all over body  . Dizziness and weakness   Immunizations Administered    Name Date Dose VIS Date Route   Moderna COVID-19 Vaccine 06/30/2019  8:54 AM 0.5 mL 03/21/2019 Intramuscular   Manufacturer: Moderna   Lot: [id number]   NDC: 62229-798-92

## 2019-07-21 ENCOUNTER — Other Ambulatory Visit: Payer: Self-pay | Admitting: Neurology

## 2019-08-01 ENCOUNTER — Ambulatory Visit: Payer: 59

## 2020-01-03 ENCOUNTER — Other Ambulatory Visit: Payer: Self-pay | Admitting: Neurology

## 2020-02-12 ENCOUNTER — Ambulatory Visit: Payer: 59 | Admitting: Neurology

## 2020-02-12 ENCOUNTER — Encounter: Payer: Self-pay | Admitting: Neurology

## 2020-02-12 VITALS — BP 124/76 | HR 94 | Ht 67.0 in | Wt 333.0 lb

## 2020-02-12 DIAGNOSIS — G4733 Obstructive sleep apnea (adult) (pediatric): Secondary | ICD-10-CM | POA: Diagnosis not present

## 2020-02-12 DIAGNOSIS — G4710 Hypersomnia, unspecified: Secondary | ICD-10-CM

## 2020-02-12 DIAGNOSIS — Z9989 Dependence on other enabling machines and devices: Secondary | ICD-10-CM | POA: Diagnosis not present

## 2020-02-12 DIAGNOSIS — E282 Polycystic ovarian syndrome: Secondary | ICD-10-CM | POA: Diagnosis not present

## 2020-02-12 NOTE — Patient Instructions (Addendum)
Chronic Fatigue Syndrome Chronic fatigue syndrome (CFS) is a condition that causes extreme tiredness (fatigue). This fatigue does not improve with rest, and it gets worse with physical or mental activity. You may have several other symptoms along with fatigue. Symptoms may come and go, but they generally last for months. Sometimes, CFS gets better over time, but it can be a lifelong condition. There is no cure, but there are many possible treatments. You will need to work with your health care providers to find a treatment plan that works best for you. What are the causes? The cause of CFS is not known. There may be more than one cause. Possible causes include:  An infection.  An abnormal body defense system (immune system).  Low blood pressure.  Poor diet.  Physical or emotional stress. What increases the risk? You are more likely to develop this condition if:  You are female.  You are 41?41 years old.  You have a family history of CFS.  You live with a lot of emotional stress. What are the signs or symptoms? The main symptom of CFS is fatigue that is severe enough to interfere with day-to-day activities. This fatigue does not get better with rest, and it gets worse with physical or mental activity. There are eight other major symptoms of CFS:  Lack of energy (malaise) that lasts more than 24 hours after physical exertion.  Sleep that does not relieve fatigue (unrefreshing sleep).  Short-term memory loss or confusion.  Joint pain without redness or swelling.  Solriamfetol tablets What is this medicine? SOLRIAMFETOL (sol ri AM fe tol) is used to treat excessive sleepiness caused by certain sleep disorders including narcolepsy and obstructive sleep apnea. This medicine may be used for other purposes; ask your health care provider or pharmacist if you have questions. COMMON BRAND NAME(S): [name] What should I tell my health care provider before I take this medicine? They need  to know if you have any of these conditions: bipolar disorder diabetes heart disease high blood pressure high cholesterol history of drug abuse or alcohol abuse problem history of stroke kidney disease schizophrenia an unusual or allergic reaction to solriamfetol, other medicines, foods, dyes, or preservatives pregnant or trying to get pregnant breast-feeding How should I use this medicine? Take this medicine by mouth with a glass of water when you first wake up. Do not take it within 9 hours of your planned bedtime. Follow the directions on the prescription label. You can take it with or without food. If it upsets your stomach, take it with food. Take your medicine at regular intervals. Do not take it more often than directed. Do not stop taking except on your doctor's advice. A special MedGuide will be given to you by the pharmacist with each prescription and refill. Be sure to read this information carefully each time. Talk to your pediatrician regarding the use of this medicine in children. Special care may be needed. Overdosage: If you think you have taken too much of this medicine contact a poison control center or emergency room at once. NOTE: This medicine is only for you. Do not share this medicine with others. What if I miss a dose? If you miss a dose, take it as soon as you can. However, avoid taking it within 9 hours of your planned bedtime, since you may find it harder to go to sleep. If it is almost time for your next dose, take only that dose. Do not take double or extra doses. What  may interact with this medicine? Do not take this medicine with any of the following medications: MAOIs like Carbex, Eldepryl, Marplan, Nardil, and Parnate This medicine may also interact with the following medications: certain medicines for Parkinson's disease like levodopa, pramipexole, or ropinirole medicines that increase blood pressure or heart rate This list may not describe all possible  interactions. Give your health care provider a list of all the medicines, herbs, non-prescription drugs, or dietary supplements you use. Also tell them if you smoke, drink alcohol, or use illegal drugs. Some items may interact with your medicine. What should I watch for while using this medicine? Visit your healthcare professional for regular checks on your progress. Tell your healthcare professional if your symptoms do not start to get better or if they get worse. This medicine has a risk of abuse and dependence. Your healthcare provider will check you for this while you take this medicine. What side effects may I notice from receiving this medicine? Side effects that you should report to your doctor or health care professional as soon as possible: allergic reactions like skin rash, itching, and hives; swelling of the face, lips, or tongue anxiety changes in emotions or moods elevated mood, decreased need for sleep, racing thoughts, impulsive behavior fast heartbeat hallucinations, loss of contact with reality irritable signs and symptoms of a dangerous increase in blood pressure like chest pain; shortness of breath; sudden severe headache; vision disturbances; seizures; decreased consciousness signs and symptoms of a stroke like changes in vision; confusion; trouble speaking or understanding; severe headaches; sudden numbness or weakness of the face, arm or leg; trouble walking; dizziness; loss of balance or coordination vomiting Side effects that usually do not require medical attention (report these to your doctor or health care professional if they continue or are bothersome): decreased appetite dry mouth increased sweating nausea trouble sleeping This list may not describe all possible side effects. Call your doctor for medical advice about side effects. You may report side effects to FDA at 1-800-FDA-1088. Where should I keep my medicine? Keep out of the reach of children. This  medicine can be abused. Keep your medicine in a safe place to protect it from theft. Do not share this medicine with anyone. Selling or giving away this medicine is dangerous and is against the law. Store at room temperature between 15 and 30 degrees C (59 and 86 degrees F). This medicine may cause harm and death if it is taken by other adults, children, or pets. Return medicine that has not been used to an official disposal site. Contact the DEA at 213-571-0304 or your city/county government to find a site. If you cannot return the medicine, mix any unused medicine with a substance like cat litter or coffee grounds. Then throw the medicine away in a sealed container like a sealed bag or coffee can with a lid. Do not use the medicine after the expiration date. NOTE: This sheet is a summary. It may not cover all possible information. If you have questions about this medicine, talk to your doctor, pharmacist, or health care provider.  2020 Elsevier/Gold Standard (2018-01-20 12:40:49)  Muscle aches.  Headaches.  Painful and swollen glands (lymph nodes) in the neck or under the arms.  Sore throat. You may also have:  Abdominal cramps, constipation, or diarrhea (irritable bowel).  Chills.  Night sweats.  Vision changes.  Dizziness.  Mental confusion (brain fog).  Clumsiness.  Sensitivity to food, noise, or odors.  Mood swings, depression, or anxiety attacks. How  is this diagnosed? There are no tests that can diagnose this condition. Your health care provider will make the diagnosis based on your medical history, a physical exam, and a mental health exam. However, it is important to make sure that your symptoms are not caused by another medical condition. You may have lab tests or X-rays to rule out other conditions. For your health care provider to diagnose CFS:  You must have had fatigue for at least 6 straight months.  Fatigue must be your first symptom, and it must be severe  enough to interfere with day-to-day activities.  There must be no other cause found for the fatigue.  You must also have at least four of the eight other major symptoms of CFS. How is this treated? There is no cure for CFS. The condition affects everyone differently. You will need to work with your team of health care providers to find the best treatments for your symptoms. Your team may include your primary care provider, physical and exercise therapists, and mental health therapists. Treatment may include:  Improving sleep with a regular bedtime routine.  Avoiding caffeine, alcohol, and tobacco.  Doing light exercise and stretching during the day.  Taking medicines to help you sleep or to relieve joint or muscle pain.  Learning and practicing relaxation techniques.  Using memory aids or doing brainteasers to improve memory and concentration.  Seeing a mental health therapist to evaluate and treat depression, if necessary.  Trying massage therapy, acupuncture, and movement exercises, such as yoga or tai chi. Follow these instructions at home:  Activity  Exercise regularly, as told by your health care provider.  Avoid fatigue by pacing yourself during the day and getting enough sleep at night.  Go to bed and get up at the same time every day. Eating and drinking  Avoid caffeine and alcohol.  Avoid heavy meals in the evening.  Eat a well-balanced diet. General instructions  Take over-the-counter and prescription medicines only as told by your health care provider.  Do not use herbal or dietary supplements unless they are approved by your health care provider.  Maintain a healthy weight.  Avoid stress and use stress-reducing techniques that you learn in therapy.  Do not use any products that contain nicotine or tobacco, such as cigarettes and e-cigarettes. If you need help quitting, ask your health care provider.  Consider joining a CFS support group.  Keep all  follow-up visits as told by your health care provider. This is important. Contact a health care provider if:  Your symptoms do not get better or they get worse.  You feel angry, guilty, anxious, or depressed. This information is not intended to replace advice given to you by your health care provider. Make sure you discuss any questions you have with your health care provider. Document Revised: 03/19/2017 Document Reviewed: 07/15/2015 Elsevier Patient Education  2020 [employer].

## 2020-02-12 NOTE — Progress Notes (Signed)
PATIENT: Karen Hendricks DOB: 1980-07-21  REASON FOR VISIT: follow up HISTORY FROM: patient  HISTORY OF PRESENT ILLNESS: RV ; interval history from  02/12/20,  I am seeing Karen Hendricks today,a meanwhile 39 year old Caucasian right-handed female with a history of idiopathic hypersomnia and obstructive sleep apnea who started using [name] with good success to treat sleepiness.  She responds well to [facility] for resideal hypersomnia but not for fatigue.  She feels fatigued, has PCOS and is obese.    She is also a CPAP user.  She is a highly compliant CPAP user at 100% for days and time with an average of 8 hours 43 minutes.  Her serial number for her air sense 10 AutoSet is [id number].  Her CPAP is set to a pressure of 11 cmH2O this 2 cm EPR and she has a residual apnea-hypopnea index of 1.2/h.  No central apneas emerging she has very very little air leakage with a 0.7 L/min leakage at the 91st percentile.  This is an ideal situation well-controlled apnea with a nasal pillow mask.  She continues to report fatigue today at the fatigue severity score endorsing 57 points.  Her Epworth sleepiness score is endorsed at 9 points which is much better. She still is not sleeping through the night, wakes twice for 15 minutes, this is improved over 30-45 minute periods in the past.   Fully vaccinated for COVID 19.     06-29-2019;She is using CPAP with an AutoSet between 11 cmH2O and 2 cm EPR setting 100% compliance for the last 30 days with an average use at time of 8 hours and 16 minutes.  The residual AHI is 2.0 and there are no central apneas emerging.  Air leaks are very minor but the 95th percentile leak strengths at 4.5 L/min.  This is a lady still reports being very fatigued and this is reflected in the endorsement of 61 points out of 63 possible points on the fatigue severity scale but her Epworth sleepiness score has decreased with the use of [name].  She endorsed this at 9 out of 24 points.    Without additional treatment the patient had severe excessive daytime sleepiness but being compliant with CPAP. Main risk factor for apnea is BMI of 52, and her smoking habit.     Karen Hendricks is seen here today, a meanwhile 39 year old caucasian female with OSA, tobacco use, persistent hypersomnia. She was diagnosed with ideopathic hypersomnia and OSA- she is usig now [place] with good success.  The patient presents today with an Epworth sleepiness score endorsing 8 out of 24 points, but still remains struggling with significant fatigue.  Fatigue severity score was 61 out of 61 points.  Her CPAP compliance is excellent as it has been all these years before she has been using the machine 100% of days and 100% of the recommended time with an average user time of 8 hours and 26 minutes, CPAP is an AutoSet set to 11 cmH2O was 2 cm expiratory pressure relief, the residual AHI is 2.4 which is excellent, no central apneas are emerging.  She has minimal air leakage.  I do not think he could improve on the apnea formed in any way, the sleepiness has benefited from [facility], she still has sometimes trouble to fall asleep at night. It is actually more the inability to sustain sleep and stay asleep. She has been on HTN medication.   Refilling [name] today. She has not taken Adderall  for several month now- had a " reserve ' at home.       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  Current BMI is 56.49.  She does change primary care providers and is now with Baldo Ash, NP  [place]. She has an extensive metabolic panel which showed that she had a high level of free testosterone, DHEA sulfate was very high at 294, TSH was in normal range there was no T3 or T4 yet checked, insulin was 31  which means that she is insulin resistant.  She does started on spironolactone and metformin which should support weight loss.   She endorsed the fatigue severity score of 59 or 63, the Epworth Sleepiness Scale at 12 points out of 24 possible points, her CPAP compliance is excellent 100% the CPAP is set at 11 cmH2O was 2 cm EPR, AHI is 2.0 which is an ideal resolution.  The median usage daily is 7 hours 52 minutes. She sleeps all Friday, Saturday and Sunday. She would like to try a new medication to help with wakefulness.      Karen Hendricks is a 39 year old female with a history of obstructive sleep apnea on CPAP, hypersomnolence and sleep disturbance.  She returns today for follow-up.   Her CPAP download indicates that she use her machine 30 out of 30 days for compliance of 100%.  She uses her machine greater than 4 hours each night.  On average she uses her machine 8 hours and 28 minutes.  Her residual AHI is 2.3 on 11 cm of water with EPR of 2.  She does not have a significant leak.  She continues to take Adderall with good benefit during the day.  She states that if she was able to lay down she would be able to fall asleep on it but she is not having trouble staying awake during the day.  She states that she during the week she wakes up 3-4 times during the night.  On the weekends she wakes up more often since she is able to sleep longer.  She has tried Belsomra, xyrem, Celexa, Wellbutrin and melatonin without good benefit.  The patient states that she would like to be able to sleep all night long as she feels that she would feel more rested during the day.  She returns today for an evaluation.  HISTORY  09/10/2016, I am seeing Karen Hendricks today who has been 100% compliant CPAP user with an average user time of 8 hours and 46 minutes on 11 cm CPAP with a residual AHI of only 2.4 no major air leaks are noted. Her fatigue severity score remains very high at 63 her Epworth sleepiness score is 11. The patient is  a participant in a idiopathic hypersomnia patient group. She learned about some trials of using medication such as clarithromycin and flumazenil cream. I certainly cannot find a reason not to try flumazenil cream as it is not systemically working. There has been a research group she informed me of at [place], [place], [place]. The trial is supposingly closed.   REVIEW OF SYSTEMS: Out of a complete 14 system review of symptoms, the patient complains only of the following symptoms, and all other reviewed systems are negative.  How likely are you to  doze in the following situations: 0 = not likely, 1 = slight chance, 2 = moderate chance, 3 = high chance  Sitting and Reading? 2 Watching Television? 2 Sitting inactive in a public place (theater or meeting)?0 Lying down in the afternoon when circumstances permit? 2 Sitting and talking to someone?0 Sitting quietly after lunch without alcohol?0 In a car, while stopped for a few minutes in traffic?0 As a passenger in a car for an hour without a break?2  Total = 9/ 24   06-29-2019.    ALLERGIES: Allergies  Allergen Reactions  . Codeine Itching    REACTION: itch insomnia  . Doxycycline Swelling    Angioedema Lips swell    HOME MEDICATIONS: Outpatient Medications Prior to Visit  Medication Sig Dispense Refill  . buPROPion (WELLBUTRIN XL) 150 MG 24 hr tablet TAKE 1 TABLET BY MOUTH EVERY DAY 90 tablet 2  . Melatonin 10 MG CAPS 5 to 10 mg one hour before intended bedtime. (Patient taking differently: 10 mg. 5 to 10 mg one hour before intended bedtime.) 30 capsule 0  . metFORMIN (GLUCOPHAGE) 500 MG tablet Take 1,000 mg by mouth daily.     . metoprolol succinate (TOPROL-XL) 50 MG 24 hr tablet Take 50 mg by mouth daily.    . rosuvastatin (CRESTOR) 10 MG tablet 4 (four) times a week.     . Semaglutide (OZEMPIC, 0.25 OR 0.5 MG/DOSE, [place]) Inject 1 mg into the skin once a week.     . spironolactone (ALDACTONE) 100 MG tablet Take 100 mg by mouth daily.      . [name] 150 MG TABS TAKE 1 TABLET BY MOUTH EVERY DAY 90 tablet 1   No facility-administered medications prior to visit.    PAST MEDICAL HISTORY: Past Medical History:  Diagnosis Date  . Hidradenitis suppurativa   . Hypersomnia   . Hypersomnia, persistent 04/17/2013   Persistent hypersomnia on CPAP, abnormal MSLT - XYREM controls hypersomnia now, remaining on CPAP.   . IUD mechanical complication 09/05/2013  . Narcolepsy   . OSA on CPAP   . Sinus infection   . Weight gain 03/2014   Cushing's??  Work up    PAST SURGICAL HISTORY: Past Surgical History:  Procedure Laterality Date  . adenoid surgery    . HYSTEROSCOPY N/A 09/07/2013   Procedure: HYSTEROSCOPY Removal of IUD arm;  Surgeon: Sherian Rein, MD;  Location: [facility];  Service: Gynecology;  Laterality: N/A;  . NASAL SINUS SURGERY  jan 2013  . TONSILLECTOMY      FAMILY HISTORY: Family History  Problem Relation Age of Onset  . Hypertension Mother   . Psoriasis Father     SOCIAL HISTORY: Social History   Socioeconomic History  . Marital status: Married    Spouse name: [name]  . Number of children: 1  . Years of education: 84  . Highest education level: Not on file  Occupational History  . Occupation: Programmer, multimedia: [employer]  Tobacco Use  . Smoking status: Current Every Day Smoker    Packs/day: 1.00    Years: 21.00    Pack years: 21.00    Types: Cigarettes  . Smokeless tobacco: Never Used  Substance and Sexual Activity  . Alcohol use: Yes    Alcohol/week: 0.0 standard drinks    Comment: rare  . Drug use: No  . Sexual activity: Not on file  Other Topics Concern  . Not on file  Social History Narrative  . Married,  one daughter "Eliany Mccarter".    Social Determinants of Health   Financial Resource Strain:   . Difficulty of Paying Living Expenses: Not on file  Food Insecurity:   . Worried About Programme researcher, broadcasting/film/video in the Last Year: Not on file  . Ran Out of Food  in the Last Year: Not on file  Transportation Needs:   . Lack of Transportation (Medical): Not on file  . Lack of Transportation (Non-Medical): Not on file  Physical Activity:   . Days of Exercise per Week: Not on file  . Minutes of Exercise per Session: Not on file  Stress:   . Feeling of Stress : Not on file  Social Connections:   . Frequency of Communication with Friends and Family: Not on file  . Frequency of Social Gatherings with Friends and Family: Not on file  . Attends Religious Services: Not on file  . Active Member of Clubs or Organizations: Not on file  . Attends Banker Meetings: Not on file  . Marital Status: Not on file  Intimate Partner Violence:   . Fear of Current or Ex-Partner: Not on file  . Emotionally Abused: Not on file  . Physically Abused: Not on file  . Sexually Abused: Not on file      PHYSICAL EXAM  Vitals:   02/12/20 1058  BP: 124/76  Pulse: 94  Weight: (!) 333 lb (151 kg)  Height: 5\' 7"  (1.702 m)   Body mass index is 52.16 kg/m.  Generalized: Well developed, in no acute distress.  no ankle edema, no intention tremor.   Neurological examination  Mentation: Alert oriented to time, place, history taking. In good mood, cooperative.  Follows all commands speech and language fluent. Cranial nerve : intact smell and taste sense-  Pupils were equally round and of equal size, promptly reactive to light and accomodation.   Uvula and tongue move in midline.  Head turning and shoulder shrug were normal and symmetric. Motor: Symmetric motor tone is noted throughout. Equal bulk, equal ROM on upper extremities.  Sensory: testing is intact to vibration.  No evidence of extinction is noted.  Coordination: no changing in penman ship. Her work as a is requiring a steady hand.   Gait and station: Gait is of wider base -Tandem gait is normal. No drift is seen.  Reflexes: Deep tendon reflexes are symmetrically attenuated  bilaterally.   DIAGNOSTIC DATA (LABS, IMAGING, TESTING) - I reviewed patient records, labs, notes, testing and imaging myself where available. CPAP download.    ASSESSMENT AND PLAN 39 y.o. year old female  has a past medical history of Hidradenitis suppurativa, Hypersomnia, Hypersomnia, persistent (04/17/2013), IUD mechanical complication (09/05/2013), Narcolepsy, OSA on CPAP, Sinus infection, and Weight gain (03/2014). seen here for 15 minutes with:  1.  Obstructive sleep apnea on CPAP, highly compliant 100%  . Loves the nasal pillow mask !  2.  Persistent Hypersomnolence has resolved on [name]. Refilled today. 3.  Morbid Obesity. Is this the main cause for fatigue-  May be related to underlying inflammation?  Sed rate and c reactive protein, ANA, Hbaic.   The patient CPAP download shows excellent compliance and good treatment of her apnea. She is encouraged to continue using her CPAP nightly.    Refilled  [name]. Rv in 12 month, after that yearly.    04/2014, MD   02/12/2020, 11:07 AM [facility] [address] [place], [place] [phone]

## 2020-02-13 ENCOUNTER — Telehealth: Payer: Self-pay | Admitting: *Deleted

## 2020-02-13 LAB — C-REACTIVE PROTEIN: CRP: 16 mg/L — ABNORMAL HIGH (ref 0–10)

## 2020-02-13 LAB — SEDIMENTATION RATE: Sed Rate: 18 mm/hr (ref 0–32)

## 2020-02-13 NOTE — Telephone Encounter (Signed)
-----   Message from Melvyn Novas, MD sent at 02/13/2020  9:09 AM EDT ----- Elevated C reactive protein in line with BMI- underlying inflammatory marker is increased in obesity, but should be higher in an rheumatological disorder. Sed rate is normal.

## 2020-02-13 NOTE — Progress Notes (Signed)
Elevated C reactive protein in line with BMI- underlying inflammatory marker is increased in obesity, but should be higher in an rheumatological disorder. Sed rate is normal.

## 2020-02-13 NOTE — Telephone Encounter (Signed)
I spoke to the patient. She verbalized understanding of the lab findings. 

## 2020-03-12 ENCOUNTER — Other Ambulatory Visit: Payer: Self-pay | Admitting: Neurology

## 2020-03-13 ENCOUNTER — Telehealth: Payer: Self-pay | Admitting: Neurology

## 2020-03-13 ENCOUNTER — Other Ambulatory Visit: Payer: Self-pay | Admitting: Neurology

## 2020-03-13 NOTE — Telephone Encounter (Signed)
I called pt. I advised her that she should have refills left from the RX for wellbutrin on 07/24/2019. She will contact [facility] and let me know of further issues.

## 2020-03-13 NOTE — Telephone Encounter (Signed)
Pt. is requesting a refill for buPROPion (WELLBUTRIN XL) 150 MG 24 hr tablet.  Pharmacy: [facility] [phone]

## 2020-07-01 ENCOUNTER — Telehealth: Payer: Self-pay | Admitting: Neurology

## 2020-07-01 NOTE — Telephone Encounter (Signed)
PA submitted through cover my med[facility] rx LEX:[id number] Will wait for determination

## 2020-07-02 NOTE — Telephone Encounter (Signed)
Request Reference Number: [id number]. [name] TAB 150MG  is approved through 07/01/2021

## 2020-07-03 ENCOUNTER — Other Ambulatory Visit: Payer: Self-pay | Admitting: Neurology

## 2021-02-10 ENCOUNTER — Ambulatory Visit: Payer: 59 | Admitting: Neurology

## 2021-02-24 ENCOUNTER — Other Ambulatory Visit: Payer: Self-pay | Admitting: Neurology

## 2021-02-25 ENCOUNTER — Other Ambulatory Visit: Payer: Self-pay | Admitting: Neurology

## 2021-04-10 ENCOUNTER — Ambulatory Visit: Payer: 59 | Admitting: Neurology

## 2021-04-10 VITALS — BP 118/75 | HR 76 | Ht 67.0 in | Wt 337.5 lb

## 2021-04-10 DIAGNOSIS — Z9989 Dependence on other enabling machines and devices: Secondary | ICD-10-CM

## 2021-04-10 DIAGNOSIS — E282 Polycystic ovarian syndrome: Secondary | ICD-10-CM

## 2021-04-10 DIAGNOSIS — G4710 Hypersomnia, unspecified: Secondary | ICD-10-CM

## 2021-04-10 DIAGNOSIS — G4733 Obstructive sleep apnea (adult) (pediatric): Secondary | ICD-10-CM | POA: Diagnosis not present

## 2021-04-10 DIAGNOSIS — E8881 Metabolic syndrome: Secondary | ICD-10-CM

## 2021-04-10 MED ORDER — SUNOSI 150 MG PO TABS
1.0000 | ORAL_TABLET | Freq: Every day | ORAL | 5 refills | Status: DC
Start: 2021-04-10 — End: 2021-10-20

## 2021-04-10 MED ORDER — BUPROPION HCL ER (XL) 150 MG PO TB24
150.0000 mg | ORAL_TABLET | Freq: Every day | ORAL | 1 refills | Status: DC
Start: 2021-04-10 — End: 2021-12-08

## 2021-04-10 NOTE — Progress Notes (Signed)
Elevated C reactive protein in line with BMI- underlying inflammatory marker is increased in obesity, but should be higher in an rheumatological disorder. Sed rate is normal.    PATIENT: Karen Hendricks DOB: 07-14-1980  REASON FOR VISIT: follow up HISTORY FROM: patient  HISTORY OF PRESENT ILLNESS: RV ; interval history from  04/10/21,   04-10-2021,  I am seeing Karen Hendricks today,a meanwhile 40 year old Caucasian right-handed female with a history of idiopathic hypersomnia, insulin resistance, and obstructive sleep apnea who started using [name] with good success to treat sleepiness.  She responds well to [facility] for resideal hypersomnia but not for fatigue.  She feels fatigued, has PCOS and is obese. Her ozempic regimen has been interrupted by supply chain problems, back ordered. She lost weight.  She needs a new CPAP and continues for now to do well with the old CPAP. Set up was 04-21-2015 . I will order a HST.    She is also a CPAP user.  She is a highly compliant CPAP user at 100% for days and time with an average of 8 hours 43 minutes.  Her serial number for her air sense 10 AutoSet is [id number].  Her CPAP is set to a pressure of 11 cmH2O this 2 cm EPR and she has a residual apnea-hypopnea index of 1.2/h.  No central apneas emerging she has very very little air leakage with a 0.7 L/min leakage at the 91st percentile.  This is an ideal situation well-controlled apnea with a nasal pillow mask.  She continues to report fatigue today at the fatigue severity score endorsing 57 points.  Her Epworth sleepiness score is endorsed at 9 points which is much better. She still is not sleeping through the night, wakes twice for 15 minutes, this is improved over 30-45 minute periods in the past.   Fully vaccinated for COVID 19.     06-29-2019;She is using CPAP with an AutoSet between 11 cmH2O and 2 cm EPR setting 100% compliance for the last 30 days with an average use at time of 8 hours and 16  minutes.  The residual AHI is 2.0 and there are no central apneas emerging.  Air leaks are very minor but the 95th percentile leak strengths at 4.5 L/min.  This is a lady still reports being very fatigued and this is reflected in the endorsement of 61 points out of 63 possible points on the fatigue severity scale but her Epworth sleepiness score has decreased with the use of [name].  She endorsed this at 9 out of 24 points.   Without additional treatment the patient had severe excessive daytime sleepiness but being compliant with CPAP. Main risk factor for apnea is BMI of 52, and her smoking habit.     Karen Hendricks is seen here today, a meanwhile 40 year old caucasian female with OSA, tobacco use, persistent hypersomnia. She was diagnosed with ideopathic hypersomnia and OSA- she is usig now [place] with good success.  The patient presents today with an Epworth sleepiness score endorsing 8 out of 24 points, but still remains struggling with significant fatigue.  Fatigue severity score was 61 out of 61 points.  Her CPAP compliance is excellent as it has been all these years before she has been using the machine 100% of days and 100% of the recommended time with an average user time of 8 hours and 26 minutes, CPAP is an AutoSet set to 11 cmH2O was 2 cm expiratory pressure relief, the residual AHI  is 2.4 which is excellent, no central apneas are emerging.  She has minimal air leakage.  I do not think he could improve on the apnea formed in any way, the sleepiness has benefited from [facility], she still has sometimes trouble to fall asleep at night. It is actually more the inability to sustain sleep and stay asleep. She has been on HTN medication.   Refilling [name] today. She has not taken Adderall for several month now- had a " reserve ' at home.       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  Current BMI is 56.49.  She does change primary care providers and is now with Baldo Ash, NP  [place] [facility]. She has an extensive metabolic panel which showed that she had a high level of free testosterone, DHEA sulfate was very high at 294, TSH was in normal range there was no T3 or T4 yet checked, insulin was 31 which means that she is insulin resistant.  She does started on spironolactone and metformin which should support weight loss.   She endorsed the fatigue severity score of 59 or 63, the Epworth Sleepiness Scale at 12 points out of 24 possible points, her CPAP compliance is excellent 100% the CPAP is set at 11 cmH2O was 2 cm EPR, AHI is 2.0 which is an ideal resolution.  The median usage daily is 7 hours 52 minutes. She sleeps all Friday, Saturday and Sunday. She would like to try a new medication to help with wakefulness.      Karen Hendricks is a 40 year old female with a history of obstructive sleep apnea on CPAP, hypersomnolence and sleep disturbance.  She returns today for follow-up.   Her CPAP download indicates that she use her machine 30 out of 30 days for compliance of 100%.  She uses her machine greater than 4 hours each night.  On average she uses her machine 8 hours and 28 minutes.  Her residual AHI is 2.3 on 11 cm of water with EPR of 2.  She does not have a significant leak.  She continues to take Adderall with good benefit during the day.  She states that if she was able to lay down she would be able to fall asleep on it but she is not having trouble staying awake during the day.  She states that she during the week she wakes up 3-4 times during the night.  On the weekends she wakes up more often since she is able to sleep longer.  She has tried Belsomra, xyrem, Celexa, Wellbutrin and melatonin without good benefit.  The patient states that she would like to be able to sleep  all night long as she feels that she would feel more rested during the day.  She returns today for an evaluation.  HISTORY  09/10/2016, I am seeing Karen Hendricks today who has been 100% compliant CPAP user with an average user time of 8 hours and 46 minutes on 11 cm CPAP with a residual AHI of only 2.4 no major air leaks are noted. Her fatigue severity score remains very high at 63 her Epworth sleepiness score is 11. The patient is a participant in a idiopathic hypersomnia patient group. She learned about some trials of using medication such as  clarithromycin and flumazenil cream. I certainly cannot find a reason not to try flumazenil cream as it is not systemically working. There has been a research group she informed me of at [place], [place], [place]. The trial is supposingly closed.   REVIEW OF SYSTEMS: Out of a complete 14 system review of symptoms, the patient complains only of the following symptoms, and all other reviewed systems are negative.  How likely are you to doze in the following situations: 0 = not likely, 1 = slight chance, 2 = moderate chance, 3 = high chance  Sitting and Reading? 2 Watching Television? 2 Sitting inactive in a public place (theater or meeting)?0 Lying down in the afternoon when circumstances permit? 2 Sitting and talking to someone?0 Sitting quietly after lunch without alcohol?0 In a car, while stopped for a few minutes in traffic?0 As a passenger in a car for an hour without a break?2  Total = 9/ 24   06-29-2019.    ALLERGIES: Allergies  Allergen Reactions   Codeine Itching    REACTION: itch insomnia   Doxycycline Swelling    Angioedema Lips swell    HOME MEDICATIONS: Outpatient Medications Prior to Visit  Medication Sig Dispense Refill   buPROPion (WELLBUTRIN XL) 150 MG 24 hr tablet TAKE 1 TABLET BY MOUTH EVERY DAY 90 tablet 2   Melatonin 10 MG CAPS 5 to 10 mg one hour before intended bedtime. (Patient taking differently: 10 mg. 5 to 10 mg one hour  before intended bedtime.) 30 capsule 0   metFORMIN (GLUCOPHAGE) 500 MG tablet Take 1,000 mg by mouth daily.      metoprolol succinate (TOPROL-XL) 50 MG 24 hr tablet Take 50 mg by mouth daily.     rosuvastatin (CRESTOR) 10 MG tablet 4 (four) times a week.      Semaglutide, 2 MG/DOSE, (OZEMPIC, 2 MG/DOSE,) 8 MG/3ML SOPN Inject 2 mg into the skin once a week.     Solriamfetol HCl ([name]) 150 MG TABS TAKE 1 TABLET BY MOUTH DAILY 90 tablet 0   spironolactone (ALDACTONE) 100 MG tablet Take 100 mg by mouth daily.      Semaglutide (OZEMPIC, 0.25 OR 0.5 MG/DOSE, [place]) Inject 1 mg into the skin once a week.      No facility-administered medications prior to visit.    PAST MEDICAL HISTORY: Past Medical History:  Diagnosis Date   Hidradenitis suppurativa    Hypersomnia    Hypersomnia, persistent 04/17/2013   Persistent hypersomnia on CPAP, abnormal MSLT - XYREM controls hypersomnia now, remaining on CPAP.    IUD mechanical complication 09/05/2013   Narcolepsy    OSA on CPAP    Sinus infection    Weight gain 03/2014   Cushing's??  Work up    PAST SURGICAL HISTORY: Past Surgical History:  Procedure Laterality Date   adenoid surgery     HYSTEROSCOPY N/A 09/07/2013   Procedure: HYSTEROSCOPY Removal of IUD arm;  Surgeon: Sherian Rein, MD;  Location: [facility];  Service: Gynecology;  Laterality: N/A;   NASAL SINUS SURGERY  jan 2013   TONSILLECTOMY      FAMILY HISTORY: Family History  Problem Relation Age of Onset   Hypertension Mother    Psoriasis Father     SOCIAL HISTORY: Social History   Socioeconomic History   Marital status: Married    Spouse name: [name]   Number of children: 1   Years of education: 12   Highest education level: Not on file  Occupational History   Occupation: Dealer  hygienist    Employer: [employer]  Tobacco Use   Smoking status: Current Every Day Smoker    Packs/day: 1.00    Years: 21.00    Pack years: 21.00    Types: Cigarettes    Smokeless tobacco: Never Used  Substance and Sexual Activity   Alcohol use: Yes    Alcohol/week: 0.0 standard drinks    Comment: rare   Drug use: No   Sexual activity: Not on file  Other Topics Concern   Not on file  Social History Narrative   Married, one daughter "Karen Hendricks".    Social Determinants of Health   Financial Resource Strain:    Difficulty of Paying Living Expenses: Not on file  Food Insecurity:    Worried About Programme researcher, broadcasting/film/video in the Last Year: Not on file   [employer] of Food in the Last Year: Not on file  Transportation Needs:    Lack of Transportation (Medical): Not on file   Lack of Transportation (Non-Medical): Not on file  Physical Activity:    Days of Exercise per Week: Not on file   Minutes of Exercise per Session: Not on file  Stress:    Feeling of Stress : Not on file  Social Connections:    Frequency of Communication with Friends and Family: Not on file   Frequency of Social Gatherings with Friends and Family: Not on file   Attends Religious Services: Not on file   Active Member of Clubs or Organizations: Not on file   Attends Banker Meetings: Not on file   Marital Status: Not on file  Intimate Partner Violence:    Fear of Current or Ex-Partner: Not on file   Emotionally Abused: Not on file   Physically Abused: Not on file   Sexually Abused: Not on file      PHYSICAL EXAM  Vitals:   04/10/21 1048  BP: 118/75  Pulse: 76  Weight: (!) 337 lb 8 oz (153.1 kg)  Height:  (1.702 m)   Body mass index is 52.86 kg/m.  Generalized: Well developed, in no acute distress.  no ankle edema, no intention tremor.   Neurological examination  Mentation: Alert oriented to time, place, history taking. In good mood, cooperative.  Follows all commands speech and language fluent. Cranial nerve : intact smell and taste sense-  Pupils were equally round and of equal size, promptly reactive to light and accomodation.   Uvula and tongue  move in midline.  Head turning and shoulder shrug were normal and symmetric. Motor: Symmetric motor tone is noted throughout. Equal bulk, equal ROM on upper extremities.  Sensory: testing is intact to vibration.  No evidence of extinction is noted.  Coordination: no changing in penman ship. 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   Gait and station: Gait is of wider base -Tandem gait is normal. No drift is seen.  Reflexes: Deep tendon reflexes are symmetrically attenuated bilaterally.   DIAGNOSTIC DATA (LABS, IMAGING, TESTING) - I reviewed patient records, labs, notes, testing and imaging myself where available. CPAP download.   Today's 30-day download reviewed 100% compliant of 30 out of 30 days with the last date being 04-08-2021.  8 hours and 53 minutes of the average user time for this patient who CPAP is set at 11 cmH2O with 2 cm expiratory pressure relief, 95th percentile air leak is 5 L which is minimal, events per hour residual apnea hypopnea 1.2/h  so this is a very good resolution      How likely are you to doze in the following situations: 0 = not likely, 1 = slight chance, 2 = moderate chance, 3 = high chance  Sitting and Reading? 1 Watching Television?1  Sitting inactive in a public place (theater or meeting)? Lying down in the afternoon when circumstances permit?3 Sitting and talking to someone? Sitting quietly after lunch without alcohol? In a car, while stopped for a few minutes in traffic? As a passenger in a car for an hour without a break?1  Total = 6/ 24 points     ASSESSMENT AND PLAN 40 y.o. year old female  has a past medical history of Hidradenitis suppurativa, Hypersomnia, Hypersomnia, persistent (04/17/2013), IUD mechanical complication (09/05/2013), Narcolepsy, OSA on CPAP, Sinus infection, and Weight gain (03/2014). seen here for 15 minutes with:  the patient's CPAP was set up in January 2017 so it is now over 38 years old and she is now  due for a new CPAP.  I usually like to have a home sleep test to confirm that apnea is still present and if the patient allows me I will just order one for the baseline her sleepiness have persisted when apnea was treated and has only responded well since she is on [name].  Medical underlying conditions were also related to BMI polycystic ovarian syndrome with insulin resistance, and narcolepsy.  1.  Obstructive sleep apnea on CPAP, highly compliant 100%  . Loves the nasal pillow mask ! New machine due- will check baseline with HST.   2.  Persistent Hypersomnolence has resolved on [name]. Refilled today.Epworth 6/ 24 now !!!! Refill.  3.  Morbid Obesity. Is this the main cause for fatigue? Ozempic has helped with weightloss but is on backorder now.   Refilled  [name]. Rv in 12 month, after that yearly.    Melvyn Novas, MD   04/10/2021, 11:12 AM [facility] [address] [place], [place] [address] [phone]

## 2021-04-24 ENCOUNTER — Telehealth: Payer: Self-pay | Admitting: Neurology

## 2021-04-24 NOTE — Telephone Encounter (Signed)
LVM for pt to call me back to schedule sleep study  

## 2021-05-06 ENCOUNTER — Telehealth: Payer: Self-pay | Admitting: Neurology

## 2021-05-06 NOTE — Telephone Encounter (Signed)
LVM for pt to call me back to schedule sleep study  

## 2021-05-07 ENCOUNTER — Telehealth: Payer: Self-pay | Admitting: Neurology

## 2021-05-07 NOTE — Telephone Encounter (Signed)
Returned pt's call and LVM for pt to call me back to schedule sleep study ° °

## 2021-05-13 ENCOUNTER — Telehealth: Payer: Self-pay | Admitting: Neurology

## 2021-05-13 NOTE — Telephone Encounter (Signed)
We have attempted to call the patient 2 times to schedule sleep study. Patient has been unavailable at the phone numbers we have on file and has not returned our calls. If patient calls back we will schedule them for their sleep study. ° °

## 2021-07-08 ENCOUNTER — Telehealth: Payer: Self-pay | Admitting: Neurology

## 2021-07-08 NOTE — Telephone Encounter (Signed)
PA completed on [facility] rx ?[id number] ?Will await determination ?

## 2021-07-09 NOTE — Telephone Encounter (Signed)
Request Reference Number: [id number]. [name] TAB 150MG  is approved through 07/09/2022. Your patient may now fill this prescription and it will be covered ?

## 2021-07-14 ENCOUNTER — Ambulatory Visit (INDEPENDENT_AMBULATORY_CARE_PROVIDER_SITE_OTHER): Payer: 59 | Admitting: Neurology

## 2021-07-14 DIAGNOSIS — E8881 Metabolic syndrome: Secondary | ICD-10-CM

## 2021-07-14 DIAGNOSIS — G4733 Obstructive sleep apnea (adult) (pediatric): Secondary | ICD-10-CM

## 2021-07-14 DIAGNOSIS — E282 Polycystic ovarian syndrome: Secondary | ICD-10-CM

## 2021-07-14 DIAGNOSIS — G4710 Hypersomnia, unspecified: Secondary | ICD-10-CM

## 2021-07-16 NOTE — Progress Notes (Signed)
? ?  ?  ?  [facility] ?  ?HOME SLEEP TEST REPORT ( by Watch PAT)   ?STUDY DATE:  07-16-2021 ?  ?ORDERING CLINICIAN: Melvyn Novas, MD  ?REFERRING CLINICIAN:  ?  ?CLINICAL INFORMATION/HISTORY: 04-10-2021, ?  ?I am seeing Karen Hendricks, a 41 year old Caucasian right-handed female with a history of idiopathic hypersomnia, insulin resistance, and obstructive sleep apnea who started using [name] with good success to treat sleepiness. She responds well to [facility] for resideal hypersomnia but not for fatigue reduction.  ?Her ozempic regimen has been interrupted by supply chain problems, medication is back ordered. She lost weight on it. ?  Her CPAP is set to a pressure of 11 cmH2O this 2 cm EPR and she has a residual apnea-hypopnea index of 1.2/h.  She needs a new CPAP and continues for now to do well with the old CPAP. Set up was 04-21-2015 . I will order a HST.  ? ?  ?Epworth sleepiness score: 9/24.  ?BMI: 52.9kg/m? ?Neck Circumference: 16" ?  ?Sleep Summary: ?  ?Total Recording Time (hours, min): Total recording time was 7 hours and 38 minutes of which the patient slept only for 3 hours and 41 minutes.   ?The overall AHI was 11.2/h.      ?  ?Respiratory Indices: ?  ?Calculated pAHI (per hour):  11.2/h  ?There was not enough REM sleep recorded to allow a differentiation in AHI between these 2 sleep stages.  No positional or snoring data were available.                                                                   ?  ?Oxygen Saturation Statistics: ?  ?O2 Saturation Range (%): Between a nadir of 89 and a maximum of 98% oxygenation with a mean saturation of 93%.                                    ?  ?O2 Saturation (minutes) <89%:    0 minutes     ?  ?Pulse Rate Statistics: ?   ?  ?Pulse Range: Between 68 101 bpm with a mean heart rate of 83 bpm.             ?  ?IMPRESSION:  This HST confirms the presence of presence of mild obstructive sleep apnea without hypoxemia.  T  ?RECOMMENDATION:The patient has been  doing very well on CPAP of 11 cmH2O pressure was 2 cm EPR and her new machine will be an autotitrator, set between 6 and 14 cmH2O was 2 cm EPR.  The patient has a mask of her choice. She is using a nasal pillow mask.Marland Kitchen ?Heated humidification will be provided.  I prefer a ResMed device for this patient. ?  ? ?INTERPRETING PHYSICIAN: ? ?Melvyn Novas, MD  ? ?Medical Director of [facility].  ? ? ? ? ? ? ? ? ? ? ? ? ? ? ? ? ? ? ? ? ? ?

## 2021-07-22 DIAGNOSIS — E8881 Metabolic syndrome: Secondary | ICD-10-CM | POA: Insufficient documentation

## 2021-07-22 NOTE — Procedures (Signed)
?  [facility] ?  ?HOME SLEEP TEST REPORT ( by Watch PAT)   ?STUDY DATE:  07-16-2021 ?  ?ORDERING CLINICIAN: Thea Holshouser, MD  ?REFERRING CLINICIAN:  ?  ?CLINICAL INFORMATION/HISTORY: 04-10-2021, ?  ?I am seeing Karen Hendricks, a 40-year-old Caucasian right-handed female with a history of idiopathic hypersomnia, insulin resistance, and obstructive sleep apnea who started using [name] with good success to treat sleepiness. She responds well to [name] for resideal hypersomnia but not for fatigue reduction.  ?Her ozempic regimen has been interrupted by supply chain problems, medication is back ordered. She lost weight on it. ?  Her CPAP is set to a pressure of 11 cmH2O this 2 cm EPR and she has a residual apnea-hypopnea index of 1.2/h.  She needs a new CPAP and continues for now to do well with the old CPAP. Set up was 04-21-2015 . I will order a HST.  ? ?  ?Epworth sleepiness score: 9/24.  ?BMI: 52.9kg/m? ?Neck Circumference: 16" ?  ?Sleep Summary: ?  ?Total Recording Time (hours, min): Total recording time was 7 hours and 38 minutes of which the patient slept only for 3 hours and 41 minutes.   ?The overall AHI was 11.2/h.      ?  ?Respiratory Indices: ?  ?Calculated pAHI (per hour):  11.2/h  ?There was not enough REM sleep recorded to allow a differentiation in AHI between these 2 sleep stages.  No positional or snoring data were available.                                                                   ?  ?Oxygen Saturation Statistics: ?  ?O2 Saturation Range (%): Between a nadir of 89 and a maximum of 98% oxygenation with a mean saturation of 93%.                                    ?  ?O2 Saturation (minutes) <89%:    0 minutes     ?  ?Pulse Rate Statistics: ?   ?  ?Pulse Range: Between 68 101 bpm with a mean heart rate of 83 bpm.             ?  ?IMPRESSION:  This HST confirms the presence of presence of mild obstructive sleep apnea without hypoxemia.  T  ?RECOMMENDATION:The patient has been  doing very well on CPAP of 11 cmH2O pressure was 2 cm EPR and her new machine will be an autotitrator, set between 6 and 14 cmH2O was 2 cm EPR.  The patient has a mask of her choice. She is using a nasal pillow mask.. ?Heated humidification will be provided.  I prefer a ResMed device for this patient. ?  ? ?INTERPRETING PHYSICIAN: ? ?Treana Lacour, MD  ? ?Medical Director of [facility].  ? ? ? ? ? ? ? ? ? ? ? ? ? ? ? ? ? ? ? ? ? ?

## 2021-07-22 NOTE — Progress Notes (Signed)
IMPRESSION:  This HST confirms the presence of presence of mild obstructive sleep apnea without hypoxemia.  T? ?RECOMMENDATION:The patient has been doing very well on CPAP of 11 cmH2O pressure was 2 cm EPR and her new machine will be an autotitrator, set between 6 and 14 cmH2O was 2 cm EPR.  The patient has a mask of her choice. She is using a nasal pillow mask.Marland Kitchen ?Heated humidification will be provided.  I prefer a ResMed device for this patient.

## 2021-07-22 NOTE — Addendum Note (Signed)
Addended by: Larey Seat on: 07/22/2021 05:42 PM ? ? Modules accepted: Orders ? ?

## 2021-07-23 ENCOUNTER — Telehealth: Payer: Self-pay | Admitting: *Deleted

## 2021-07-23 NOTE — Telephone Encounter (Signed)
LVM for pt to call about results. °

## 2021-07-23 NOTE — Telephone Encounter (Signed)
-----   Message from Larey Seat, MD sent at 07/22/2021  5:41 PM EDT ----- ?IMPRESSION:  This HST confirms the presence of presence of mild obstructive sleep apnea without hypoxemia.  T? ?RECOMMENDATION:The patient has been doing very well on CPAP of 11 cmH2O pressure was 2 cm EPR and her new machine will be an autotitrator, set between 6 and 14 cmH2O was 2 cm EPR.  The patient has a mask of her choice. She is using a nasal pillow mask.Marland Kitchen ?Heated humidification will be provided.  I prefer a ResMed device for this patient. ?

## 2021-07-23 NOTE — Telephone Encounter (Signed)
Pt returned call. I advised pt that Dr. Vickey Huger reviewed their sleep study results and found that pt still has sleep apnea in mild form. Dr. Vickey Huger recommends that pt continue CPAP. I reviewed PAP compliance expectations with the pt. Pt is agreeable to starting a CPAP. I advised pt that an order will be sent to a DME, [facility], and [facility] will call the pt within about one week after they file with the pt's insurance. [facility] will show the pt how to use the machine, fit for masks, and troubleshoot the CPAP if needed. A follow up appt was made for insurance purposes with Dr. Vickey Huger on 10/20/2021 at 10:30 am. Pt verbalized understanding to arrive 15 minutes early and bring their CPAP. A letter with all of this information in it will be mailed to the pt as a reminder. I verified with the pt that the address we have on file is correct. Pt verbalized understanding of results. Pt had no questions at this time but was encouraged to call back if questions arise. I have sent the order to [facility] and have received confirmation that they have received the order. ? ?

## 2021-10-15 ENCOUNTER — Encounter: Payer: Self-pay | Admitting: Neurology

## 2021-10-20 ENCOUNTER — Encounter: Payer: Self-pay | Admitting: Neurology

## 2021-10-20 ENCOUNTER — Ambulatory Visit: Payer: 59 | Admitting: Neurology

## 2021-10-20 VITALS — BP 127/85 | HR 66 | Ht 67.0 in | Wt 337.0 lb

## 2021-10-20 DIAGNOSIS — Z9989 Dependence on other enabling machines and devices: Secondary | ICD-10-CM

## 2021-10-20 DIAGNOSIS — G4733 Obstructive sleep apnea (adult) (pediatric): Secondary | ICD-10-CM | POA: Diagnosis not present

## 2021-10-20 DIAGNOSIS — G4700 Insomnia, unspecified: Secondary | ICD-10-CM | POA: Diagnosis not present

## 2021-10-20 DIAGNOSIS — G478 Other sleep disorders: Secondary | ICD-10-CM

## 2021-10-20 DIAGNOSIS — G4710 Hypersomnia, unspecified: Secondary | ICD-10-CM | POA: Diagnosis not present

## 2021-10-20 DIAGNOSIS — E8881 Metabolic syndrome: Secondary | ICD-10-CM | POA: Diagnosis not present

## 2021-10-20 MED ORDER — SUNOSI 150 MG PO TABS: 1.0000 | ORAL_TABLET | Freq: Every day | ORAL | 5 refills | Status: DC

## 2021-10-20 MED ORDER — QUVIVIQ 50 MG PO TABS
50.0000 mg | ORAL_TABLET | Freq: Every evening | ORAL | 5 refills | Status: DC
Start: 2021-10-20 — End: 2022-03-06

## 2021-10-20 NOTE — Progress Notes (Signed)
Elevated C reactive protein in line with BMI- underlying inflammatory marker is increased in obesity, but should be higher in an rheumatological disorder. Sed rate is normal.    PATIENT: Karen Hendricks DOB: [birth year]  REASON FOR VISIT: follow up HISTORY FROM: patient  HISTORY OF PRESENT ILLNESS: RV ; interval history from  10/20/21,    Here after recently repeated HST with new machine;  This HST confirms the presence of presence of mild obstructive sleep apnea without hypoxemia.  HST -RECOMMENDATION:The patient has been doing very well on CPAP of 11 cmH2O pressure was 2 cm EPR and her new machine will be an autotitrator, set between 6 and 14 cmH2O was 2 cm EPR.  The patient has a mask of her choice. She is using a nasal pillow mask. Heated humidification will be provided.  I prefer a ResMed device for this patient.    10-20-2021: This established hypersomia and apnea patient feels she still sleeps" choppy" and she wants to try something to help her sleep through the night. She has a low metabolism, on Ozempic still not losing as much weight. She is taking [name], she adheres to sleep hygiene and avoids all caffeine. She was a poor sleeper as an infant. She falls asleep immediately but can't stay asleep, is up 5-6 times each night.   She would like to try [name]. / daridorexant.   [name] at 62/ 63 points.  Epworth Sleepiness score: 7/ 24.    04-10-2021: I am seeing Karen Hendricks today,a meanwhile 41 year old Caucasian right-handed female with a history of idiopathic hypersomnia, insulin resistance, and obstructive sleep apnea who started using [name] with good success to treat sleepiness.  She responds well to [name] for resideal hypersomnia but not for fatigue.  She feels fatigued, has PCOS and is obese. Her ozempic regimen has been interrupted by supply chain problems, back ordered. She lost weight.  She needs a new CPAP and continues for now to do well with the old CPAP. Set up was 04-21-2015  . I will order a HST.    She is also a CPAP user.  She is a highly compliant CPAP user at 100% for days and time with an average of 8 hours 43 minutes.  Her serial number for her air sense 10 AutoSet is [name].  Her CPAP is set to a pressure of 11 cmH2O this 2 cm EPR and she has a residual apnea-hypopnea index of 1.2/h.  No central apneas emerging she has very very little air leakage with a 0.7 L/min leakage at the 91st percentile.  This is an ideal situation well-controlled apnea with a nasal pillow mask.  She continues to report fatigue today at the fatigue severity score endorsing 57 points.  Her Epworth sleepiness score is endorsed at 9 points which is much better. She still is not sleeping through the night, wakes twice for 15 minutes, this is improved over 30-45 minute periods in the past.   Fully vaccinated for COVID 19.     06-29-2019;She is using CPAP with an AutoSet between 11 cmH2O and 2 cm EPR setting 100% compliance for the last 30 days with an average use at time of 8 hours and 16 minutes.  The residual AHI is 2.0 and there are no central apneas emerging.  Air leaks are very minor but the 95th percentile leak strengths at 4.5 L/min.  This is a lady still reports being very fatigued and this is reflected in the endorsement of 61 points  out of 63 possible points on the fatigue severity scale but her Epworth sleepiness score has decreased with the use of [name].  She endorsed this at 9 out of 24 points.   Without additional treatment the patient had severe excessive daytime sleepiness but being compliant with CPAP. Main risk factor for apnea is BMI of 52, and her smoking habit.     Karen Hendricks is seen here today, a meanwhile 41 year old caucasian female with OSA, tobacco use, persistent hypersomnia. She was diagnosed with ideopathic hypersomnia and OSA- she is usig now [name] with good success.  The patient presents today with an Epworth sleepiness score endorsing 8 out of 24 points,  but still remains struggling with significant fatigue.  Fatigue severity score was 61 out of 61 points.  Her CPAP compliance is excellent as it has been all these years before she has been using the machine 100% of days and 100% of the recommended time with an average user time of 8 hours and 26 minutes, CPAP is an AutoSet set to 11 cmH2O was 2 cm expiratory pressure relief, the residual AHI is 2.4 which is excellent, no central apneas are emerging.  She has minimal air leakage.  I do not think he could improve on the apnea formed in any way, the sleepiness has benefited from [facility], she still has sometimes trouble to fall asleep at night. It is actually more the inability to sustain sleep and stay asleep. She has been on HTN medication.   Refilling [name] today. She has not taken Adderall for several month now- had a " reserve ' at home.       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  Current BMI is 56.49.  She does change primary care providers and is now with [name], NP  [name] [name]. She has an extensive metabolic panel which showed that she had a high level of free testosterone, DHEA sulfate was very high at 294, TSH was in normal range there was no T3 or T4 yet checked, insulin was 31 which means that she is insulin resistant.  She does started on spironolactone and metformin which should support weight loss.   She endorsed the fatigue severity score of 59 or 63, the Epworth Sleepiness Scale at 12 points out of 24 possible points, her CPAP compliance is excellent 100% the CPAP is set at 11 cmH2O was 2 cm EPR, AHI is 2.0 which is an ideal resolution.  The median usage daily is 7 hours 52 minutes. She sleeps all Friday, Saturday and Sunday. She would like to try a new  medication to help with wakefulness.      Ms. Karen Hendricks is a 41 year old female with a history of obstructive sleep apnea on CPAP, hypersomnolence and sleep disturbance.  She returns today for follow-up.   Her CPAP download indicates that she use her machine 30 out of 30 days for compliance of 100%.  She uses her machine greater than 4 hours each night.  On average she uses her machine 8 hours and 28 minutes.  Her residual AHI is 2.3 on 11 cm of water with EPR of 2.  She does not have a significant leak.  She continues to take Adderall with good benefit during the day.  She states that if she was able to lay down she would be able to fall asleep on it but she is not having trouble staying awake during the day.  She states that she during the week she wakes up 3-4 times during the night.  On the weekends she wakes up more often since she is able to sleep longer.  She has tried Belsomra, xyrem, Celexa, Wellbutrin and melatonin without good benefit.  The patient states that she would like to be able to sleep all night long as she feels that she would feel more rested during the day.  She returns today for an evaluation.  HISTORY  09/10/2016, I am seeing Karen Hendricks today who has been 100% compliant CPAP user with an average user time of 8 hours and 46 minutes on 11 cm CPAP with a residual AHI of only 2.4 no major air leaks are noted. Her fatigue severity score remains very high at 63 her Epworth sleepiness score is 11. The patient is a participant in a idiopathic hypersomnia patient group. She learned about some trials of using medication such as clarithromycin and flumazenil cream. I certainly cannot find a reason not to try flumazenil cream as it is not systemically working. There has been a research group she informed me of at [place], [place], [place]. The trial is supposingly closed.   REVIEW OF SYSTEMS: Out of a complete 14 system review of symptoms, the patient complains only of the following symptoms, and  all other reviewed systems are negative.   [name] at 62/ 63 points.   Epworth Sleepiness score: 7/ 24. 10-20-2021:  How likely are you to doze in the following situations: 0 = not likely, 1 = slight chance, 2 = moderate chance, 3 = high chance  Sitting and Reading? 2 Watching Television? 2 Sitting inactive in a public place (theater or meeting)?0 Lying down in the afternoon when circumstances permit? 2 Sitting and talking to someone?0 Sitting quietly after lunch without alcohol?0 In a car, while stopped for a few minutes in traffic?0 As a passenger in a car for an hour without a break?2  Total =  now 7 points ( 7-3-20230  from 9/ 24 on  06-29-2019.   [name] at 62/ 63 points, HIGH    ALLERGIES: Allergies  Allergen Reactions   Codeine Itching    REACTION: itch insomnia   Doxycycline Swelling    Angioedema Lips swell    HOME MEDICATIONS: Outpatient Medications Prior to Visit  Medication Sig Dispense Refill   buPROPion (WELLBUTRIN XL) 150 MG 24 hr tablet Take 1 tablet (150 mg total) by mouth daily. 90 tablet 1   Melatonin 10 MG CAPS 5 to 10 mg one hour before intended bedtime. (Patient taking differently: 10 mg. 5 to 10 mg one hour before intended bedtime.) 30 capsule 0   metFORMIN (GLUCOPHAGE) 500 MG tablet Take 500 mg by mouth daily.     metoprolol succinate (TOPROL-XL) 50 MG 24 hr tablet Take 50 mg by mouth daily.     rosuvastatin (CRESTOR) 10 MG tablet 4 (four) times a week.      Semaglutide, 2 MG/DOSE, (OZEMPIC, 2 MG/DOSE,) 8 MG/3ML SOPN Inject 2 mg into the skin once a week.     Solriamfetol HCl ([name]) 150 MG TABS Take 1 tablet by mouth daily. 30 tablet 5   spironolactone (ALDACTONE) 100 MG tablet Take 100 mg by mouth daily.      No facility-administered medications prior to visit.    PAST MEDICAL  HISTORY: Past Medical History:  Diagnosis Date   Hidradenitis suppurativa    Hypersomnia    Hypersomnia, persistent 04/17/2013   Persistent hypersomnia on CPAP, abnormal  MSLT - XYREM controls hypersomnia now, remaining on CPAP.    IUD mechanical complication 09/05/2013   Narcolepsy    OSA on CPAP    Sinus infection    Weight gain 03/2014   Cushing's??  Work up    PAST SURGICAL HISTORY: Past Surgical History:  Procedure Laterality Date   adenoid surgery     HYSTEROSCOPY N/A 09/07/2013   Procedure: HYSTEROSCOPY Removal of IUD arm;  Surgeon: Sherian Rein, MD;  Location: [facility];  Service: Gynecology;  Laterality: N/A;   NASAL SINUS SURGERY  jan 2013   TONSILLECTOMY      FAMILY HISTORY: Family History  Problem Relation Age of Onset   Hypertension Mother    Psoriasis Father     SOCIAL HISTORY: Social History   Socioeconomic History   Marital status: Married    Spouse name: [name]   Number of children: 1   Years of education: 12   Highest education level: Not on file  Occupational History   Occupation: Programmer, multimedia: [employer]  Tobacco Use   Smoking status: Current Every Day Smoker    Packs/day: 1.00    Years: 21.00    Pack years: 21.00    Types: Cigarettes   Smokeless tobacco: Never Used  Substance and Sexual Activity   Alcohol use: Yes    Alcohol/week: 0.0 standard drinks    Comment: rare   Drug use: No   Sexual activity: Not on file  Other Topics Concern   Not on file  Social History Narrative   Married, one daughter "Hannahgrace Lalli".    Social Determinants of Health   Financial Resource Strain:    Difficulty of Paying Living Expenses: Not on file  Food Insecurity:    Worried About Programme researcher, broadcasting/film/video in the Last Year: Not on file   [employer] of Food in the Last Year: Not on file  Transportation Needs:    Lack of Transportation (Medical): Not on file   Lack of Transportation (Non-Medical): Not on file  Physical Activity:    Days of Exercise per Week: Not on file   Minutes of Exercise per Session: Not on file  Stress:    Feeling of Stress : Not on file  Social Connections:    Frequency  of Communication with Friends and Family: Not on file   Frequency of Social Gatherings with Friends and Family: Not on file   Attends Religious Services: Not on file   Active Member of Clubs or Organizations: Not on file   Attends Banker Meetings: Not on file   Marital Status: Not on file  Intimate Partner Violence:    Fear of Current or Ex-Partner: Not on file   Emotionally Abused: Not on file   Physically Abused: Not on file   Sexually Abused: Not on file      PHYSICAL EXAM  Vitals:   10/20/21 1043  BP: 127/85  Pulse: 66  Weight: (!) 337 lb (152.9 kg)  Height: 5\' 7"  (1.702 m)   Body mass index is 52.78 kg/m.  Generalized: Well developed, in no acute distress.  no ankle edema, no intention tremor.   Neck size ; 16.75 "  Mallampati; 1-2 , uvula is laterally crowded.   Neurological examination  Mentation: Alert oriented to time,  place, history taking. In good mood, cooperative.  Follows all commands speech and language fluent. Cranial nerve : intact smell and taste sense-  Pupils are equally round and of equal size, promptly reactive to light and accomodation.   Uvula and tongue move in midline.  Head turning and shoulder shrug were normal and symmetric. Motor: Symmetric motor tone is noted throughout. Equal bulk, equal ROM on upper extremities.  Sensory: testing is intact to vibration.  No evidence of extinction is noted.  Coordination: no changing in penman ship. 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   Gait and station: Gait is of wider base -Tandem gait is normal. No drift is seen.  Reflexes: Deep tendon reflexes are symmetrically attenuated bilaterally.   DIAGNOSTIC DATA (LABS, IMAGING, TESTING) - I reviewed patient records, labs, notes, testing and imaging myself where available. CPAP download.   Today's 10-20-2021:  30-day download reviewed 100% compliant of 30 out of 30 days with the last date being6-28-2023.  Average user time  is 9 hours 32 minutes, 100% compliant minimum pressure is set at 6 maximum pressure at 14 with 2 cm of water expiratory pressure relief.  Residual AHI is 1.6/h which is excellent no central apneas are arising all events are obstructive in origin the 95th percentile pressure is 13.8 cm water which tells me that we should probably offer at least 2 cm up. Minimal air leak.     04-08-2021.  8 hours and 53 minutes of the average user time for this patient who CPAP is set at 11 cmH2O with 2 cm expiratory pressure relief, 95th percentile air leak is 5 L which is minimal, events per hour residual apnea hypopnea 1.2/h so this is a very good resolution.   How likely are you to doze in the following situations: 0 = not likely, 1 = slight chance, 2 = moderate chance, 3 = high chance Total = 6/ 24 points     ASSESSMENT AND PLAN 41 y.o. year old female  has a past medical history of Hidradenitis suppurativa, Hypersomnia, Hypersomnia, persistent (04/17/2013), IUD mechanical complication (123456), Narcolepsy, OSA on CPAP, Sinus infection, and Weight gain (03/2014). seen here for 15 minutes with:  the patient's CPAP was set up in May 31-2023 though adapt: [place]  new CPAP auto 10.   I usually like to have a home sleep test to confirm that apnea is still present and if the patient allows me I will just order one for the baseline her sleepiness have persisted when apnea was treated and has only responded well since she is on [name].  Medical underlying conditions were also related to BMI polycystic ovarian syndrome with insulin resistance, and narcolepsy.  1.  Obstructive sleep apnea on CPAP, highly compliant 100% , baseline AHI was 11/h. Marland Kitchen Loves the nasal pillow mask !   2.  Persistent Hypersomnolence has resolved on [name]. Refilled today.Epworth 6/ 24 now !!!! Refill.  3.  Morbid Obesity. Is this the main cause for fatigue? Ozempic has helped with weightloss but is on backorder now.   4. Sleep  fragmentation-Karen Hendricks had never difficulties to initiate sleep and she would have difficulties to nap in the afternoon either, her problem is that she cannot stay asleep and the cause of frequent arousals is not nocturia.  She may have sometimes vivid dreams but still this do not wake her up.  There is no history of sleep paralysis or cataplexy.  She has had an episode of sleepwalking  under medication Xyrem which was ordered for idiopathic hypersomnia.  I would not like to try her on a GABAergic medicine such as Ambien, Sonata or benzodiazepines as these can make sleep apnea worse.  They also are highly addictive.  I will therefore start quvivic.  Tricyclics have not been used, due to weight gain effects. .  Refilled  [name]. [id number]. [name] TAB 150MG  is approved through 07/09/2022. Your patient may now fill this prescription and it will be covered  Started [name], ordered today, certainly will need PA.  Rv in 6 months.    Larey Seat, MD   10/20/2021, 11:08 AM [facility] [address], [place], [place] [address] [phone]

## 2021-10-20 NOTE — Patient Instructions (Signed)
QUVIVIQT (daridorexant)  [name] Tablets What is this medication? [name] (DAR i doe REX ant) treats insomnia. It helps you go to sleep faster and stay asleep through the night. This medicine may be used for other purposes; ask your health care provider or pharmacist if you have questions. COMMON BRAND NAME(S): QUVIVIQ What should I tell my care team before I take this medication? They need to know if you have any of these conditions: Depression Frequently drink alcohol History of a sudden onset of muscle weakness (cataplexy) History of falling asleep often at unexpected times (narcolepsy) History of substance use disorder Liver disease Lung or breathing disease Sleep apnea Sleep-walking, driving, eating or other activity while not fully awake after taking a sleep medication Suicidal thoughts, plans, or attempt by you or a family member An unusual or allergic reaction to daridorexant, other medications, foods, dyes, or preservatives Pregnant or trying to get pregnant Breast-feeding How should I use this medication? Take this medication by mouth with water 30 minutes before going to bed. Take it as directed on the prescription label. It is better to take this medication on an empty stomach. A special MedGuide will be given to you by the pharmacist with each prescription and refill. Be sure to read this information carefully each time. Talk to your care team about the use of this medication in children. Special care may be needed. Overdosage: If you think you have taken too much of this medicine contact a poison control center or emergency room at once. NOTE: This medicine is only for you. Do not share this medicine with others. What if I miss a dose? This does not apply. This medication should only be taken as directed before going to sleep. Do not take double or extra doses. What may interact with this medication? Alcohol Benzodiazepines, such as alprazolam, diazepam, or  lorazepam Bosentan Certain antibiotics, such as erythromycin or clarithromycin Certain antihistamines Certain antivirals for HIV or hepatitis Certain medications for depression, anxiety, or mental health conditions Certain medications for fungal infections, such as itraconazole, ketoconazole, posaconazole, voriconazole Certain medications for seizures, such as carbamazepine, phenytoin, phenobarbital Diltiazem Grapefruit juice Medications that cause drowsiness before a procedure, such as propofol Medications that relax muscles Opioids for pain or cough Other medications that help you fall asleep Phenothiazines, such as chlorpromazine, prochlorperazine, thioridazine Rifampin St. John's wort Verapamil This list may not describe all possible interactions. Give your health care provider a list of all the medicines, herbs, non-prescription drugs, or dietary supplements you use. Also tell them if you smoke, drink alcohol, or use illegal drugs. Some items may interact with your medicine. What should I watch for while using this medication? Visit your care team for regular checks on your progress. Keep a regular sleep schedule by going to bed at about the same time each night. Avoid caffeine-containing drinks in the evening hours. Talk to your care team if your insomnia worsens or is not better within 7 to 10 days. You may do unusual sleep behaviors or activities you do not remember the day after taking this medication. Activities include driving, making or eating food, talking on the phone, sexual activity, or sleep walking. Stop taking this medication and call your care team right away if you find out you have done activities like this. Plan to go to bed and stay in bed for a full night (7 to 8 hours) after you take this medication. You may still be drowsy the morning after taking this medication. This medication may  affect your coordination, reaction time, or judgment. Do not drive or operate  machinery until you know how this medication affects you. Sit up or stand slowly to reduce the risk of dizzy or fainting spells. Watch for new or worsening thoughts of suicide or depression. This includes sudden changes in mood, behaviors, or thoughts. These changes can happen at any time but are more common in the beginning of treatment or after a change in dose. Call your care team right away if you experience these thoughts or worsening depression. After you stop taking this medication, you may have trouble falling asleep. This is called rebound insomnia. This problem usually goes away on its own after 1 or 2 nights. What side effects may I notice from receiving this medication? Side effects that you should report to your care team as soon as possible: Allergic reactions--skin rash, itching, hives, swelling of the face, lips, tongue, or throat CNS depression--slow or shallow breathing, shortness of breath, feeling faint, dizziness, confusion, trouble staying awake Mood and behavior changes--anxiety, nervousness, confusion, hallucinations, irritability, hostility, thoughts of suicide or self-harm, worsening mood, feelings of depression Sudden and temporary muscle weakness Unable to move or speak for several minutes upon waking or going to sleep Unusual sleep behaviors or activities you do not remember such as driving, eating, or sexual activity Side effects that usually do not require medical attention (report these to your care team if they continue or are bothersome): Drowsiness the day after use Fatigue Headache This list may not describe all possible side effects. Call your doctor for medical advice about side effects. You may report side effects to FDA at 1-800-FDA-1088. Where should I keep my medication? Keep out of the reach of children and pets. This medication can be abused. Keep it in a safe place to protect it from theft. Do not share it with anyone. It is only for you. Selling or  giving away this medication is dangerous and against the law. Store between 20 and 25 degrees C (68 and 77 degrees F). Get rid of any unused medication after the expiration date. This medication may cause harm and death if it is taken by other adults, children, or pets. It is important to get rid of the medication as soon as you no longer need it or it is expired. You can do this in two ways: Take the medication to a medication take-back program. Check with your pharmacy or law enforcement to find a location. If you cannot return the medication, check the label or package insert to see if the medication should be thrown out in the garbage or flushed down the toilet. If you are not sure, ask your care team. If it is safe to put it in the trash, take the medication out of the container. Mix the medication with cat litter, dirt, coffee grounds, or other unwanted substance. Seal the mixture in a bag or container. Put it in the trash. NOTE: This sheet is a summary. It may not cover all possible information. If you have questions about this medicine, talk to your doctor, pharmacist, or health care provider.  2023 Elsevier/Gold Standard (2021-05-26 00:00:00)

## 2021-10-20 NOTE — Addendum Note (Signed)
Addended by: Melvyn Novas on: 10/20/2021 11:38 AM   Modules accepted: Orders

## 2021-10-20 NOTE — Progress Notes (Signed)
CM sent to [facility] for new order ?

## 2021-12-03 ENCOUNTER — Other Ambulatory Visit: Payer: Self-pay | Admitting: Neurology

## 2021-12-06 ENCOUNTER — Other Ambulatory Visit: Payer: Self-pay | Admitting: Neurology

## 2021-12-16 ENCOUNTER — Encounter: Payer: Self-pay | Admitting: Neurology

## 2022-02-23 ENCOUNTER — Telehealth: Payer: Self-pay | Admitting: Neurology

## 2022-02-23 NOTE — Telephone Encounter (Signed)
Completed Pa on [facility]/optum rx KEY: [id number] Will await determination

## 2022-02-25 NOTE — Telephone Encounter (Addendum)
PA is denied. Insurance is requesting that the pt try Dayvigo first before they will consider covering Quviviq. Dr Dohmeier is out of the office but can ask if she would be willing to prescribe this for the patient once she returns.

## 2022-03-06 MED ORDER — DAYVIGO 10 MG PO TABS: 10.0000 mg | ORAL_TABLET | Freq: Every day | ORAL | 5 refills | Status: DC

## 2022-03-06 NOTE — Addendum Note (Signed)
Addended by: Melvyn Novas on: 03/06/2022 12:33 PM   Modules accepted: Orders

## 2022-03-06 NOTE — Telephone Encounter (Signed)
Dayvigo 10 mg daily po, script was sent today.CD .

## 2022-03-09 NOTE — Telephone Encounter (Signed)
Called and LVM for pt letting her know Lennox Solders denied and Dr. Vickey Huger calledin Oliva Bustard instead to take the place of this. Advised she takes 1 po qd. Sent to [facility]. Asked her to call office back if she has any questions.

## 2022-03-11 ENCOUNTER — Other Ambulatory Visit: Payer: Self-pay | Admitting: *Deleted

## 2022-03-11 MED ORDER — DAYVIGO 10 MG PO TABS: 10.0000 mg | ORAL_TABLET | Freq: Every day | ORAL | 5 refills | Status: DC

## 2022-03-11 NOTE — Telephone Encounter (Signed)
Took call from phone staff and spoke with [facility] pharmacy. States rx for Oliva Bustard has 1 po qd at breakfast. Should be at bedtime. Aware I will send to MD to send in corrected script.

## 2022-03-20 ENCOUNTER — Other Ambulatory Visit (HOSPITAL_COMMUNITY): Payer: Self-pay

## 2022-03-20 MED ORDER — OZEMPIC (2 MG/DOSE) 8 MG/3ML ~~LOC~~ SOPN
2.0000 mg | PEN_INJECTOR | SUBCUTANEOUS | 5 refills | Status: DC
  Filled 2022-03-20: qty 3, 28d supply, fill #0
  Filled 2022-05-14 – 2022-05-25 (×2): qty 3, 28d supply, fill #1
  Filled 2022-07-05 – 2022-07-23 (×2): qty 3, 28d supply, fill #2

## 2022-03-24 NOTE — Telephone Encounter (Signed)
PA Dayvigo submitted on covermymeds. Key: [id number]. Waiting on determination from [facility].

## 2022-03-24 NOTE — Telephone Encounter (Signed)
"  Request Reference Number: [id number]. DAYVIGO TAB 10MG  is approved through 03/25/2023. Your patient may now fill this prescription and it will be covered."

## 2022-04-15 ENCOUNTER — Ambulatory Visit: Payer: 59 | Admitting: Neurology

## 2022-05-21 ENCOUNTER — Other Ambulatory Visit (HOSPITAL_COMMUNITY): Payer: Self-pay

## 2022-05-25 ENCOUNTER — Other Ambulatory Visit (HOSPITAL_COMMUNITY): Payer: Self-pay

## 2022-06-01 ENCOUNTER — Other Ambulatory Visit: Payer: Self-pay | Admitting: Neurology

## 2022-06-25 ENCOUNTER — Telehealth: Payer: Self-pay | Admitting: Neurology

## 2022-06-25 NOTE — Telephone Encounter (Signed)
Pt returned call stating that the pharmacy has contacted her and was able to fill the medication at a lower cost and she doesn't need samples.

## 2022-06-25 NOTE — Telephone Encounter (Signed)
**  If the pharmacy calls back they were suppose to give [place] an idea of what the cost would be with patient's insurance since I was able to get it approved.   **if patient calls back please ask if she is ok with picking up samples and if so I can get them ready for pick up.

## 2022-06-25 NOTE — Telephone Encounter (Signed)
Pt said, coupon system has been hacked. Can not get the prescription for Solriamfetol HCl ([name]) 150 MG TABS. Want to know if neurologist can send in a prescription for Adderall to [facility] [phone] . Can leave a vm confirming Adderall has been sent.

## 2022-06-25 NOTE — Telephone Encounter (Signed)
PA approved for the patient until 12/26/22 through [facility] Case number: [id number]

## 2022-06-25 NOTE — Telephone Encounter (Signed)
Her PA is due for re authorization. I will first complete a prior authorization for the patient through insurance. PA submitted on CMM/optum [id number] Will await determination

## 2022-06-25 NOTE — Telephone Encounter (Signed)
Called pt back and she stated it's not the PA. Stated the coupon system has been hacked and it's been down for two week now, Stated medication Solriamfetol HCl ([name]) 150 MG TABS  is $1900.00 without coupon and she can't pay that. Asking if Dr. Latrelle Dodrill will just call in a prescription for Adderall until the system is fixed.

## 2022-06-25 NOTE — Telephone Encounter (Signed)
Called [facility] and was able to speak with a pharmacist who does mention that the system with the coupons being processed has been hacked. This has been an ongoing issue and they are trying to get it resolved. Advised in the meantime I asked the pharmacy to process through insurance with the PA on file to see what cost would be. Waiting to hear from them on that but I can provide samples for the patient to pick up.

## 2022-07-15 ENCOUNTER — Encounter: Payer: Self-pay | Admitting: Neurology

## 2022-07-15 ENCOUNTER — Ambulatory Visit: Payer: Self-pay | Admitting: Neurology

## 2022-07-15 VITALS — BP 125/84 | HR 88 | Ht 67.0 in | Wt 347.4 lb

## 2022-07-15 DIAGNOSIS — E8881 Metabolic syndrome: Secondary | ICD-10-CM

## 2022-07-15 DIAGNOSIS — E282 Polycystic ovarian syndrome: Secondary | ICD-10-CM

## 2022-07-15 DIAGNOSIS — G47419 Narcolepsy without cataplexy: Secondary | ICD-10-CM

## 2022-07-15 DIAGNOSIS — G4700 Insomnia, unspecified: Secondary | ICD-10-CM

## 2022-07-15 DIAGNOSIS — G478 Other sleep disorders: Secondary | ICD-10-CM

## 2022-07-15 DIAGNOSIS — G4733 Obstructive sleep apnea (adult) (pediatric): Secondary | ICD-10-CM

## 2022-07-15 MED ORDER — LUMRYZ 4.5 G PO PACK: 4.5000 g | PACK | Freq: Every day | ORAL | 5 refills | Status: DC

## 2022-07-15 NOTE — Patient Instructions (Signed)
ASSESSMENT AND PLAN 42 y.o. year old female  here with:    1) narcolepsy / Hypersomnia - fragmented sleep - LUMRYZ will be our new approach at 4.5 gram once  a day at bedtime. Titrating upwards as needed.   2) continue CPAP use. No changes in settings.  Further weight gain was noted today, up 2 points on BMI.   3) once Lumryz is dispensed, stop Dayvigo and [name].    I plan to follow up either personally or through our NP within 3 months.

## 2022-07-15 NOTE — Progress Notes (Signed)
Provider:  Larey Seat, MD  Primary Care Physician:  Michael Boston, [place] [address]     Referring Provider: Michael Boston, [address] [place],  [place] [address]          Chief Complaint according to patient   Patient presents with:     New Patient (Initial Visit)           HISTORY OF PRESENT ILLNESS:  Karen Hendricks is a 42 y.o. female patient who is here for revisit 07/15/2022 for narcolepsy. .  Chief concern according to patient :  I can't sleep uninterrupted, I am up 5-7 times at night- and on medication I do no longer dream, I am up and down affecting CPAP compliance.  She feels fatigued and not able to exercise. She is on and off Ozempic, due to availability. Has lost weight and gained it back. Dr Chalmers Cater.  [name] has helped to reduce excessive daytime sleepiness.  She had sleepwalking episodes under Xyrem, always after the second dse. Can she use a one time dosing for the new preparation?  Dayvigo at 10 mg has not been very helpful, her sleep remains fragmented without having the dreams.   CPAP compliance is excellent, there will be 100% by days and hours with an average of 9 hours 4 minutes.  The AutoSet uses a setting between 6 and 16 cmH2O with 2 cm expiratory relief the residual AHI is 1.8/h no central apneas were emerging, 95th percentile pressure is 15.6 cm water and air leak is low at 7.9 L a minute.  I would like to up the maximum pressure by 2 cm from 6-18 cm water.     RV ; interval history from  10/20/21,  Here after recently repeated HST with new machine;  This HST confirms the presence of presence of mild obstructive sleep apnea without hypoxemia.  HST -RECOMMENDATION:The patient has been doing very well on CPAP of 11 cmH2O pressure was 2 cm EPR and her new machine will be an autotitrator, set between 6 and 14 cmH2O was 2 cm EPR.  The patient has a mask of her choice. She is using a nasal pillow mask. Heated humidification will  be provided.  I prefer a ResMed device for this patient.      10-20-2021: This established hypersomia and apnea patient feels she still sleeps" choppy" and she wants to try something to help her sleep through the night. She has a low metabolism, on Ozempic still not losing as much weight. She is taking [name], she adheres to sleep hygiene and avoids all caffeine. She was a poor sleeper as an infant. She falls asleep immediately but can't stay asleep, is up 5-6 times each night.    She would like to try [name]. / daridorexant.     Review of Systems: Out of a complete 14 system review, the patient complains of only the following symptoms, and all other reviewed systems are negative.:  Fatigue, sleepiness , snoring, fragmented sleep,.   How likely are you to doze in the following situations: 0 = not likely, 1 = slight chance, 2 = moderate chance, 3 = high chance   Sitting and Reading? Watching Television? Sitting inactive in a public place (theater or meeting)? As a passenger in a car for an hour without a break? Lying down in the afternoon when circumstances permit? Sitting and talking to someone? Sitting quietly after lunch without  alcohol? In a car, while stopped for a few minutes in traffic?   Total = 7/ 24 points   [name] endorsed at 63/ 63 points.   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 [employer]  Tobacco Use   Smoking status: Every Day    Packs/day: 1.00    Years: 21.00    Additional pack years: 0.00    Total pack years: 21.00    Types: Cigarettes   Smokeless tobacco: Never  Substance and Sexual Activity   Alcohol use: Yes    Alcohol/week: 0.0 standard drinks of alcohol    Comment: rare   Drug use: No   Sexual activity: Not on  file  Other Topics Concern   Not on file  Social History Narrative   Not on file   Social Determinants of Health   Financial Resource Strain: Not on file  Food Insecurity: Not on file  Transportation Needs: Not on file  Physical Activity: Not on file  Stress: Not on file  Social Connections: Not on file    Family History  Problem Relation Age of Onset   Hypertension Mother    Psoriasis Father     Past Medical History:  Diagnosis Date   Hidradenitis suppurativa    Hypersomnia    Hypersomnia, persistent 04/17/2013   Persistent hypersomnia on CPAP, abnormal MSLT - XYREM controls hypersomnia now, remaining on CPAP.    IUD mechanical complication [id number]   Narcolepsy    OSA on CPAP    Sinus infection    Weight gain 03/2014   Cushing's??  Work up    Past Surgical History:  Procedure Laterality Date   adenoid surgery     HYSTEROSCOPY N/A 09/07/2013   Procedure: HYSTEROSCOPY Removal of IUD arm;  Surgeon: Janyth Contes, MD;  Location: [facility];  Service: Gynecology;  Laterality: N/A;   NASAL SINUS SURGERY  jan 2013   TONSILLECTOMY       Current Outpatient Medications on File Prior to Visit  Medication Sig Dispense Refill   buPROPion (WELLBUTRIN XL) 150 MG 24 hr tablet TAKE 1 TABLET(150 MG) BY MOUTH DAILY 90 tablet 3   Lemborexant (DAYVIGO) 10 MG TABS Take 10 mg by mouth at bedtime. 30 tablet 5   Melatonin 10 MG CAPS 5 to 10 mg one hour before intended bedtime. (Patient taking differently: 10 mg. 5 to 10 mg one hour before intended bedtime.) 30 capsule 0   metFORMIN (GLUCOPHAGE) 500 MG tablet Take 500 mg by mouth daily.     metoprolol succinate (TOPROL-XL) 50 MG 24 hr tablet Take 50 mg by mouth daily.     rosuvastatin (CRESTOR) 10 MG tablet 4 (four) times a week.      Semaglutide, 2 MG/DOSE, (OZEMPIC, 2 MG/DOSE,) 8 MG/3ML SOPN Inject 2 mg into the skin once a week. 3 mL 5   Solriamfetol HCl ([name]) 150 MG TABS TAKE 1 TABLET BY MOUTH DAILY 30 tablet 5    spironolactone (ALDACTONE) 100 MG tablet Take 100 mg by mouth daily.      Semaglutide, 2 MG/DOSE, (OZEMPIC, 2 MG/DOSE,) 8 MG/3ML SOPN Inject 2 mg into the skin once a week. (Patient not taking: Reported on 07/15/2022)     No  current facility-administered medications on file prior to visit.    Allergies  Allergen Reactions   Codeine Itching    REACTION: itch insomnia   Doxycycline Swelling    Angioedema Lips swell     DIAGNOSTIC DATA (LABS, IMAGING, TESTING) - I reviewed patient records, labs, notes, testing and imaging myself where available.  Lab Results  Component Value Date   WBC 7.5 09/07/2013   HGB 14.3 09/07/2013   HCT 41.7 09/07/2013   MCV 94.3 09/07/2013   PLT 241 09/07/2013      Component Value Date/Time   NA 141 10/24/2018 1212   K 5.0 10/24/2018 1212   CL 105 10/24/2018 1212   CO2 21 10/24/2018 1212   GLUCOSE 90 10/24/2018 1212   BUN 10 10/24/2018 1212   CREATININE 0.78 10/24/2018 1212   CALCIUM 9.7 10/24/2018 1212   PROT 6.8 10/24/2018 1212   ALBUMIN 4.4 10/24/2018 1212   AST 19 10/24/2018 1212   ALT 21 10/24/2018 1212   ALKPHOS 69 10/24/2018 1212   BILITOT 0.2 10/24/2018 1212   GFRNONAA 95 10/24/2018 1212   GFRAA 110 10/24/2018 1212   No results found for: "CHOL", "HDL", "LDLCALC", "LDLDIRECT", "TRIG", "CHOLHDL" No results found for: "HGBA1C" No results found for: "VITAMINB12" No results found for: "TSH"  PHYSICAL EXAM:  Today's Vitals   07/15/22 0930  BP: 125/84  Pulse: 88  Weight: (!) 347 lb 6.4 oz (157.6 kg)  Height: 5\' 7"  (1.702 m)   Body mass index is 54.41 kg/m.   Wt Readings from Last 3 Encounters:  07/15/22 (!) 347 lb 6.4 oz (157.6 kg)  10/20/21 (!) 337 lb (152.9 kg)  04/10/21 (!) 337 lb 8 oz (153.1 kg)     Ht Readings from Last 3 Encounters:  07/15/22 5\' 7"  (1.702 m)  10/20/21 5\' 7"  (1.702 m)  04/10/21 5\' 7"  (1.702 m)      General:  Well developed, in no acute distress.  no ankle edema, no intention tremor.    Neck  size ; 16.75 "   Mallampati; 1-2 , uvula is laterally crowded.    Neurological examination  Mentation: Alert oriented to time, place, history taking. In good mood, cooperative.  Follows all commands speech and language fluent. Cranial nerve : intact smell and taste sense-  Pupils are equally round and of equal size, promptly reactive to light and accomodation.   Uvula and tongue move in midline.  Head turning and shoulder shrug were normal and symmetric. Motor: Symmetric motor tone is noted throughout. Equal bulk, equal ROM on upper extremities.  Sensory: testing is intact to vibration.  No evidence of extinction is noted.  Coordination: no changing in penman ship. 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   Gait and station: Gait is of wider base -Tandem gait is normal. No drift is seen.  Reflexes: Deep tendon reflexes are symmetrically attenuated bilaterally.   ASSESSMENT AND PLAN 42 y.o. year old female  here with:    1) narcolepsy /persistent Hypersomnia - fragmented sleep - LUMRYZ will be our new approach at 4.5 gram once  a day at bedtime. Titrating upwards as needed.   2) continue CPAP use. with changes in settings.  Further weight gain was noted today, up 2 points on BMI. I will ask to increase max CPAP pressure to 18 cm water.   3) once Lumryz is dispensed, starting at 4.5 g first week, then weekly upward titration - ASA dispensed stop Dayvigo and [name].  I plan to follow up either personally or through our NP within 3 months.   I would like to thank Jacalyn Lefevre Jesse Sans, MD and Michael Boston, Md [address] [place],  [place] [address] for allowing me to meet with and to take care of this pleasant patient.   CC: I will share my notes with Dr Chalmers Cater .  After spending a total time of  30  minutes face to face and additional time for physical and neurologic examination, review of laboratory studies,  personal review of imaging studies, reports and results of  other testing and review of referral information / records as far as provided in visit,   Electronically signed by: Larey Seat, MD 07/15/2022 10:00 AM  [facility] certified by The [employer] of Sleep Medicine and Diplomate of the [employer] of Sleep Medicine. Board certified In Neurology through the [place], Fellow of the [employer] of Neurology. Medical Director of [employer].

## 2022-07-21 ENCOUNTER — Other Ambulatory Visit (HOSPITAL_COMMUNITY): Payer: Self-pay

## 2022-07-23 ENCOUNTER — Other Ambulatory Visit (HOSPITAL_COMMUNITY): Payer: Self-pay

## 2022-07-24 ENCOUNTER — Other Ambulatory Visit (HOSPITAL_COMMUNITY): Payer: Self-pay

## 2022-08-25 ENCOUNTER — Telehealth: Payer: Self-pay | Admitting: Neurology

## 2022-08-25 NOTE — Telephone Encounter (Signed)
PA completed on CMM/optum [id number] Will await response

## 2022-08-31 NOTE — Telephone Encounter (Signed)
PA approved  Request Reference Number: [id number]. LUMRYZ PAK 7.5GM is approved through 08/25/2023.

## 2022-09-01 NOTE — Telephone Encounter (Signed)
I have completed a script to send to the specialty pharmacy confirmed [facility]. The fax number provided for [facility] is [phone].

## 2022-10-13 ENCOUNTER — Ambulatory Visit (INDEPENDENT_AMBULATORY_CARE_PROVIDER_SITE_OTHER): Payer: 59 | Admitting: Neurology

## 2022-10-13 ENCOUNTER — Encounter: Payer: Self-pay | Admitting: Neurology

## 2022-10-13 VITALS — BP 130/80 | HR 80 | Ht 67.0 in | Wt 360.0 lb

## 2022-10-13 DIAGNOSIS — G47419 Narcolepsy without cataplexy: Secondary | ICD-10-CM | POA: Diagnosis not present

## 2022-10-13 DIAGNOSIS — G4700 Insomnia, unspecified: Secondary | ICD-10-CM | POA: Diagnosis not present

## 2022-10-13 DIAGNOSIS — G4733 Obstructive sleep apnea (adult) (pediatric): Secondary | ICD-10-CM | POA: Diagnosis not present

## 2022-10-13 DIAGNOSIS — T50905A Adverse effect of unspecified drugs, medicaments and biological substances, initial encounter: Secondary | ICD-10-CM

## 2022-10-13 DIAGNOSIS — G478 Other sleep disorders: Secondary | ICD-10-CM

## 2022-10-13 MED ORDER — LUMRYZ 4.5 G PO PACK: 6.0000 g | PACK | Freq: Every day | ORAL | 5 refills | Status: DC

## 2022-10-13 NOTE — Patient Instructions (Signed)
ASSESSMENT AND PLAN 42 y.o. year old female here with:   1) new onset sleep walking on 7.5  mg dose of Lumryiz.  ( Once nightly Xyrem substrate)    2) will reduce to 6 mg Lumyriz. Hopefully, that will work well.    I plan to follow up either personally or through our NP within 6 months.

## 2022-10-13 NOTE — Progress Notes (Signed)
Provider:  Melvyn Novas, MD  Primary Care Physician:  Melida Quitter, MD [address] [place] [address]     Referring Provider: Melida Quitter, Md [address] [place],  [place] [address]          Chief Complaint according to patient   Patient presents with:     New Patient (Initial Visit)           HISTORY OF PRESENT ILLNESS:  Karen Hendricks is a 41 y.o. female patient who is here for revisit 10/13/2022 for  Sleep walking episodes> .  Chief concern according to patient :  Here for follow up on narcolepsy. Stopped taking the Lumryz after starting sleep walking and fell- woke on the floor. Fell asleep on the commode, and now is scared. She did not have these side effects on the lighter dose but that didn't get her through the night. Failed Lunesta and Belsomra.   This lady has been 100% compliant CPAP patient again with the last of 30 night data accumulated from 10-11-2022.  8 hours and 22 minutes of nightly use the machine is set between 6 and 18 cm water pressure, with 2 cmH2O expiratory pressure relief.  The residual AHI is 2.5/h.  These are only obstructive apneas.  The 95th percentile pressure is 17.5.  I could theoretically set the maximum pressure to 20 and hope to even reduce any residual apnea further but that may come at the expense of comfort.  I am concerned about her ongoing high fatigue which could be related to other inflammatory or autoimmune diseases, I am also concerned of course about the sleepwalking episodes she described.  Today I will reduce her numerous as I am worried about her safety when sleepwalking.  07-15-2022: Karen Hendricks is a 42 y.o. female patient who is here for revisit 07/15/2022 for narcolepsy. .  Chief concern according to patient :  I can't sleep uninterrupted, I am up 5-7 times at night- and on medication I do no longer dream, I am up and down affecting CPAP compliance.  She feels fatigued and not able to exercise. She is on and off  Ozempic, due to availability. Has lost weight and gained it back. Dr Talmage Nap.  [name] has helped to reduce excessive daytime sleepiness.  She had sleepwalking episodes under Xyrem, always after the second dse. Can she use a one time dosing for the new preparation?  Dayvigo at 10 mg has not been very helpful, her sleep remains fragmented without having the dreams.    CPAP compliance is excellent, there will be 100% by days and hours with an average of 9 hours 4 minutes.  The AutoSet uses a setting between 6 and 16 cmH2O with 2 cm expiratory relief the residual AHI is 1.8/h no central apneas were emerging, 95th percentile pressure is 15.6 cm water and air leak is low at 7.9 L a minute.  I would like to up the maximum pressure by 2 cm from 6-18 cm water.      RV ; interval history from  10/20/21,  Here after recently repeated HST with new machine;  This HST confirms the presence of presence of mild obstructive sleep apnea without hypoxemia.  HST -RECOMMENDATION:The patient has been doing very well on CPAP of 11 cmH2O pressure was 2 cm EPR and her new machine will be an autotitrator, set between 6 and 14 cmH2O was 2 cm EPR.  The patient  has a mask of her choice. She is using a nasal pillow mask. Heated humidification will be provided.  I prefer a ResMed device for this patient.      10-20-2021: This established hypersomia and apnea patient feels she still sleeps" choppy" and she wants to try something to help her sleep through the night. She has a low metabolism, on Ozempic still not losing as much weight. She is taking [name], she adheres to sleep hygiene and avoids all caffeine. She was a poor sleeper as an infant. She falls asleep immediately but can't stay asleep, is up 5-6 times each night.        Review of Systems: Out of a complete 14 system review, the patient complains of only the following symptoms, and all other reviewed systems are negative.:  Fatigue, sleepiness , snoring, sleep walking.     How likely are you to doze in the following situations: 0 = not likely, 1 = slight chance, 2 = moderate chance, 3 = high chance   Sitting and Reading? Watching Television? Sitting inactive in a public place (theater or meeting)? As a passenger in a car for an hour without a break? Lying down in the afternoon when circumstances permit? Sitting and talking to someone? Sitting quietly after lunch without alcohol? In a car, while stopped for a few minutes in traffic?   Total = 6/ 24 points   [name] endorsed at 63/ 63 points.   Social History   Socioeconomic History   Marital status: Married    Spouse name: [name]   Number of children: 1   Years of education: 12   Highest education level: Not on file  Occupational History   Occupation: Programmer, multimedia: [employer]  Tobacco Use   Smoking status: Every Day    Packs/day: 1.00    Years: 21.00    Additional pack years: 0.00    Total pack years: 21.00    Types: Cigarettes   Smokeless tobacco: Never  Substance and Sexual Activity   Alcohol use: Yes    Alcohol/week: 0.0 standard drinks of alcohol    Comment: rare   Drug use: No   Sexual activity: Not on file  Other Topics Concern   Not on file  Social History Narrative   Not on file   Social Determinants of Health   Financial Resource Strain: Not on file  Food Insecurity: Not on file  Transportation Needs: Not on file  Physical Activity: Not on file  Stress: Not on file  Social Connections: Not on file    Family History  Problem Relation Age of Onset   Hypertension Mother    Psoriasis Father     Past Medical History:  Diagnosis Date   Hidradenitis suppurativa    Hypersomnia    Hypersomnia, persistent 04/17/2013   Persistent hypersomnia on CPAP, abnormal MSLT - XYREM controls hypersomnia now, remaining on CPAP.    IUD mechanical complication 09/05/2013   Narcolepsy    OSA on CPAP    Sinus infection    Weight gain 03/2014   Cushing's??   Work up    Past Surgical History:  Procedure Laterality Date   adenoid surgery     HYSTEROSCOPY N/A 09/07/2013   Procedure: HYSTEROSCOPY Removal of IUD arm;  Surgeon: Sherian Rein, MD;  Location: [facility];  Service: Gynecology;  Laterality: N/A;   NASAL SINUS SURGERY  jan 2013   TONSILLECTOMY       Current Outpatient  Medications on File Prior to Visit  Medication Sig Dispense Refill   Melatonin 10 MG CAPS 5 to 10 mg one hour before intended bedtime. (Patient taking differently: 10 mg. 5 to 10 mg one hour before intended bedtime.) 30 capsule 0   metFORMIN (GLUCOPHAGE) 500 MG tablet Take 500 mg by mouth daily.     metoprolol succinate (TOPROL-XL) 50 MG 24 hr tablet Take 50 mg by mouth daily.     rosuvastatin (CRESTOR) 10 MG tablet 4 (four) times a week.      spironolactone (ALDACTONE) 100 MG tablet Take 100 mg by mouth daily.      buPROPion (WELLBUTRIN XL) 150 MG 24 hr tablet TAKE 1 TABLET(150 MG) BY MOUTH DAILY (Patient not taking: Reported on 10/13/2022) 90 tablet 3   Semaglutide, 2 MG/DOSE, (OZEMPIC, 2 MG/DOSE,) 8 MG/3ML SOPN Inject 2 mg into the skin once a week. (Patient not taking: Reported on 07/15/2022)     Semaglutide, 2 MG/DOSE, (OZEMPIC, 2 MG/DOSE,) 8 MG/3ML SOPN Inject 2 mg into the skin once a week. (Patient not taking: Reported on 10/13/2022) 3 mL 5   Sodium Oxybate ER (LUMRYZ) 4.5 g PACK Take 4.5 g by mouth at bedtime. (Patient not taking: Reported on 10/13/2022) 30 each 5   Solriamfetol HCl ([name]) 150 MG TABS TAKE 1 TABLET BY MOUTH DAILY (Patient not taking: Reported on 10/13/2022) 30 tablet 5   No current facility-administered medications on file prior to visit.    Allergies  Allergen Reactions   Codeine Itching    REACTION: itch insomnia   Doxycycline Swelling    Angioedema Lips swell     DIAGNOSTIC DATA (LABS, IMAGING, TESTING) - I reviewed patient records, labs, notes, testing and imaging myself where available.  Lab Results  Component Value Date   WBC  7.5 09/07/2013   HGB 14.3 09/07/2013   HCT 41.7 09/07/2013   MCV 94.3 09/07/2013   PLT 241 09/07/2013      Component Value Date/Time   NA 141 10/24/2018 1212   K 5.0 10/24/2018 1212   CL 105 10/24/2018 1212   CO2 21 10/24/2018 1212   GLUCOSE 90 10/24/2018 1212   BUN 10 10/24/2018 1212   CREATININE 0.78 10/24/2018 1212   CALCIUM 9.7 10/24/2018 1212   PROT 6.8 10/24/2018 1212   ALBUMIN 4.4 10/24/2018 1212   AST 19 10/24/2018 1212   ALT 21 10/24/2018 1212   ALKPHOS 69 10/24/2018 1212   BILITOT 0.2 10/24/2018 1212   GFRNONAA 95 10/24/2018 1212   GFRAA 110 10/24/2018 1212   No results found for: "CHOL", "HDL", "LDLCALC", "LDLDIRECT", "TRIG", "CHOLHDL" No results found for: "HGBA1C" No results found for: "VITAMINB12" No results found for: "TSH"  PHYSICAL EXAM:  Today's Vitals   10/13/22 0929  BP: 130/80  Pulse: 80  Weight: (!) 360 lb (163.3 kg)  Height: 5\' 7"  (1.702 m)   Body mass index is 56.38 kg/m.   Wt Readings from Last 3 Encounters:  10/13/22 (!) 360 lb (163.3 kg)  07/15/22 (!) 347 lb 6.4 oz (157.6 kg)  10/20/21 (!) 337 lb (152.9 kg)     Ht Readings from Last 3 Encounters:  10/13/22 5\' 7"  (1.702 m)  07/15/22 5\' 7"  (1.702 m)  10/20/21 5\' 7"  (1.702 m)      General: TWell developed, in no acute distress.  no ankle edema, no intention tremor.    Neck size ; 16.75 "   Mallampati; 1-2 , uvula is laterally crowded.    Neurological examination  Mentation: Alert oriented to time, place, history taking. In good mood, cooperative.  Follows all commands speech and language fluent. Cranial nerve : intact smell and taste sense-  Pupils are equally round and of equal size, promptly reactive to light and accomodation.   Uvula and tongue move in midline.  Head turning and shoulder shrug were normal and symmetric. Motor: Symmetric motor tone is noted throughout. Equal bulk, equal ROM on upper extremities.  Sensory: testing is intact to vibration.  No evidence of  extinction is noted.  Coordination: no changing in penman ship. 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   Gait and station: Gait is of wider base -Tandem gait is normal. No drift is seen.  Reflexes: Deep tendon reflexes are symmetrically attenuated bilaterally.     ASSESSMENT AND PLAN 42 y.o. year old female here with:  1) new onset sleep walking on 7.5  mg dose of Lumryiz.  ( Once nightly Xyrem substrate)   2) will reduce to 6 mg Lumyriz. Hopefully, that will work well.   I plan to follow up either personally or through our NP within 6 months.   I would like to thank  Melida Quitter, Md [address] [place],  [place] [address] for allowing me to meet with and to take care of this pleasant patient.   After spending a total time of  25  minutes face to face and additional time for physical and neurologic examination, review of laboratory studies,  personal review of imaging studies, reports and results of other testing and review of referral information / records as far as provided in visit,   Electronically signed by: Melvyn Novas, MD 10/13/2022 9:56 AM  [facility] Board certified by The [employer] of Sleep Medicine and Diplomate of the [employer] of [employer]. Board certified In Neurology through the ABPN, Fellow of the [employer] of [employer].

## 2022-10-13 NOTE — Addendum Note (Signed)
Addended by: Melvyn Novas on: 10/13/2022 10:21 AM   Modules accepted: Orders

## 2022-10-14 LAB — CBC WITH DIFFERENTIAL/PLATELET
Basophils Absolute: 0.1 10*3/uL (ref 0.0–0.2)
Basos: 1 %
EOS (ABSOLUTE): 0.2 10*3/uL (ref 0.0–0.4)
Eos: 3 %
Hematocrit: 44.2 % (ref 34.0–46.6)
Hemoglobin: 15.2 g/dL (ref 11.1–15.9)
Immature Grans (Abs): 0 10*3/uL (ref 0.0–0.1)
Immature Granulocytes: 0 %
Lymphocytes Absolute: 2 10*3/uL (ref 0.7–3.1)
Lymphs: 28 %
MCH: 32.2 pg (ref 26.6–33.0)
MCHC: 34.4 g/dL (ref 31.5–35.7)
MCV: 94 fL (ref 79–97)
Monocytes Absolute: 0.4 10*3/uL (ref 0.1–0.9)
Monocytes: 5 %
Neutrophils Absolute: 4.3 10*3/uL (ref 1.4–7.0)
Neutrophils: 63 %
Platelets: 226 10*3/uL (ref 150–450)
RBC: 4.72 x10E6/uL (ref 3.77–5.28)
RDW: 13.1 % (ref 11.7–15.4)
WBC: 6.9 10*3/uL (ref 3.4–10.8)

## 2022-10-14 LAB — COMPREHENSIVE METABOLIC PANEL
ALT: 23 IU/L (ref 0–32)
AST: 20 IU/L (ref 0–40)
Albumin: 4.4 g/dL (ref 3.9–4.9)
Alkaline Phosphatase: 71 IU/L (ref 44–121)
BUN/Creatinine Ratio: 14 (ref 9–23)
BUN: 11 mg/dL (ref 6–24)
Bilirubin Total: 0.3 mg/dL (ref 0.0–1.2)
CO2: 25 mmol/L (ref 20–29)
Calcium: 9.7 mg/dL (ref 8.7–10.2)
Chloride: 100 mmol/L (ref 96–106)
Creatinine, Ser: 0.78 mg/dL (ref 0.57–1.00)
Globulin, Total: 2.6 g/dL (ref 1.5–4.5)
Glucose: 106 mg/dL — ABNORMAL HIGH (ref 70–99)
Potassium: 4.6 mmol/L (ref 3.5–5.2)
Sodium: 139 mmol/L (ref 134–144)
Total Protein: 7 g/dL (ref 6.0–8.5)
eGFR: 97 mL/min/{1.73_m2} (ref >59)

## 2022-10-19 ENCOUNTER — Telehealth: Payer: Self-pay

## 2022-10-19 NOTE — Telephone Encounter (Signed)
-----   Message from Melvyn Novas, MD sent at 10/15/2022  5:13 PM EDT ----- Normal blood level, this test was not fasting.

## 2022-10-19 NOTE — Telephone Encounter (Signed)
I left the patient a voicemail regarding normal results. A call back number was provided for any questions or concerns.

## 2022-10-28 NOTE — Telephone Encounter (Signed)
Received a notification that [facility] is who the patient gets the medication Lumryz from it was scheduled for delivery on 10/24/22

## 2023-04-22 ENCOUNTER — Encounter: Payer: Self-pay | Admitting: Neurology

## 2023-04-22 ENCOUNTER — Ambulatory Visit: Payer: No Typology Code available for payment source | Admitting: Neurology

## 2023-04-22 DIAGNOSIS — G47419 Narcolepsy without cataplexy: Secondary | ICD-10-CM

## 2023-04-22 DIAGNOSIS — Z79899 Other long term (current) drug therapy: Secondary | ICD-10-CM | POA: Insufficient documentation

## 2023-04-22 DIAGNOSIS — E282 Polycystic ovarian syndrome: Secondary | ICD-10-CM

## 2023-04-22 DIAGNOSIS — G4733 Obstructive sleep apnea (adult) (pediatric): Secondary | ICD-10-CM | POA: Diagnosis not present

## 2023-04-22 NOTE — Patient Instructions (Signed)
 ASSESSMENT AND PLAN 43 y.o. year old female  here with:  Narcolepsy, OSA , high fatigue and recently dx with DM2.     1) persistent fatigue of other origin , not related to OSA ( AHI is and now well controlled,) not related to Hypoxia.   2 EDS improved under LUMRYZ  , in comparison to [name]   will adjust the dose further or the intake time first we discussed again to split in half, but given that she can fall asleep so easily , I will adjust the intake time to 11-12 PM.   Keep 6 gram dose- no sleep walking.   3) continue OSA on CPAP.   4) IDM, now taking Mounjaro, lost 20 pounds.   Her main risk for sleep apnea is her weight, so this is great.  Antiinflammatory effect will benefit her overall health and hopefully improve fatigue.    I plan to follow up either personally or through our NP within 12 months.

## 2023-04-22 NOTE — Progress Notes (Signed)
 Provider:  Dedra Gores, MD  Primary Care Physician:  Stephane Leita DEL, MD [address] [place] [address]     Referring Provider: Stephane Leita DEL, Md [address] [place],  [place] [address]          Chief Complaint according to patient   Patient presents with:                HISTORY OF PRESENT ILLNESS:  Karen Hendricks is a 43 y.o. female patient who is here for revisit 04/22/2023 for sleep walking, narcolepsy , OSA on CPAP and persistent hypersomnia follow up.  .  Chief concern according to patient : no more sleep walking on 6 gram medication , as she had on 7 gram, she had fallen at night , sleep walking, see last visit note.  She reported still waking up at 1 AM and medication intake is at 9.30 PM. She had felt  severe grogginess while taken bid night time dosing, a hang-over feeling in AM.   Plan  Will split the dose now to 1/2 dose, take first dose at 9.30 PM and second dose at 12 midnight.   CPAP compliance remains high 100% for days and 97% for hours, 8 hours 33 minutes.  6-8 cm water, 2 cm EPR, residual AHI 2.3/h.     Karen Hendricks is a 43 y.o. female patient who is here for revisit 10/13/2022 for Sleep walking episodes> Chief concern according to patient :  Here for follow up on narcolepsy. Stopped taking the Lumryz  after starting sleep walking and fell- woke on the floor. Fell asleep on the commode, and now is scared. She did not have these side effects on the lighter dose but that didn't get her through the night. Failed Lunesta  and Belsomra .    This lady has been 100% compliant CPAP patient again with the last of 30 night data accumulated from 10-11-2022.  8 hours and 22 minutes of nightly use the machine is set between 6 and 18 cm water pressure, with 2 cmH2O expiratory pressure relief.  The residual AHI is 2.5/h.  These are only obstructive apneas.  The 95th percentile pressure is 17.5.   I could theoretically set the maximum pressure to 20 and hope to  even reduce any residual apnea further but that may come at the expense of comfort.  I am concerned about her ongoing high fatigue which could be related to other inflammatory or autoimmune diseases, I am also concerned of course about the sleepwalking episodes she described.  Today I will reduce her numerous as I am worried about her safety when sleepwalking.   07-15-2022: Karen Hendricks is a 43 y.o. female patient who is here for revisit 07/15/2022 for narcolepsy. .  Chief concern according to patient :  I can't sleep uninterrupted, I am up 5-7 times at night- and on medication I do no longer dream, I am up and down affecting CPAP compliance.  She feels fatigued and not able to exercise. She is on and off Ozempic , due to availability. Has lost weight and gained it back. Dr Tommas.  [name]  has helped to reduce excessive daytime sleepiness.  She had sleepwalking episodes under Xyrem , always after the second dse. Can she use a one time dosing for the new preparation?  Dayvigo  at 10 mg has not been very helpful, her sleep remains fragmented without having the dreams.    CPAP compliance is excellent, there will  be 100% by days and hours with an average of 9 hours 4 minutes.  The AutoSet uses a setting between 6 and 16 cmH2O with 2 cm expiratory relief the residual AHI is 1.8/h no central apneas were emerging, 95th percentile pressure is 15.6 cm water and air leak is low at 7.9 L a minute.  I would like to up the maximum pressure by 2 cm from 6-18 cm water.      RV ; interval history from  10/20/21,  Here after recently repeated HST with new machine;  This HST confirms the presence of presence of mild obstructive sleep apnea without hypoxemia.  HST -RECOMMENDATION:The patient has been doing very well on CPAP of 11 cmH2O pressure was 2 cm EPR and her new machine will be an autotitrator, set between 6 and 14 cmH2O was 2 cm EPR.  The patient has a mask of her choice. She is using a nasal pillow mask. Heated  humidification will be provided.  I prefer a ResMed device for this patient.      10-20-2021: This established hypersomia and apnea patient feels she still sleeps choppy and she wants to try something to help her sleep through the night. She has a low metabolism, on Ozempic  still not losing as much weight. She is taking [name] , she adheres to sleep hygiene and avoids all caffeine. She was a poor sleeper as an infant. She falls asleep immediately but can't stay asleep, is up 5-6 times each night.           Review of Systems: Out of a complete 14 system review, the patient complains of only the following symptoms, and all other reviewed systems are negative.:     How likely are you to doze in the following situations: 0 = not likely, 1 = slight chance, 2 = moderate chance, 3 = high chance   Sitting and Reading? Watching Television? Sitting inactive in a public place (theater or meeting)? As a passenger in a car for an hour without a break? Lying down in the afternoon when circumstances permit? Sitting and talking to someone? Sitting quietly after lunch without alcohol? In a car, while stopped for a few minutes in traffic?   Total = 6/ 24 points - better under Lumryiz 6 gram and no more sleep walking ( seen under 7.5 gram )   [name] endorsed at 60/ 63 points.  Excessive !  Social History   Socioeconomic History   Marital status: Married    Spouse name: Karen Hendricks   Number of children: 1   Years of education: 12   Highest education level: Not on file  Occupational History   Occupation: Programmer, [employer]: [employer]  Tobacco Use   Smoking status: Every Day    Current packs/day: 1.00    Average packs/day: 1 pack/day for 21.0 years (21.0 ttl pk-yrs)    Types: Cigarettes   Smokeless tobacco: Never  Substance and Sexual Activity   Alcohol use: Yes    Alcohol/week: 0.0 standard drinks of alcohol    Comment: rare   Drug use: No   Sexual activity: Not on file   Other Topics Concern   Not on file  Social History Narrative   Not on file   Social Drivers of Health   Financial Resource Strain: Not on file  Food Insecurity: Not on file  Transportation Needs: Not on file  Physical Activity: Not on file  Stress: Not on file  Social  Connections: Not on file    Family History  Problem Relation Age of Onset   Hypertension Mother    Psoriasis Father     Past Medical History:  Diagnosis Date   Hidradenitis suppurativa    Hypersomnia    Hypersomnia, persistent 04/17/2013   Persistent hypersomnia on CPAP, abnormal MSLT - XYREM  controls hypersomnia now, remaining on CPAP.    IUD mechanical complication 09/05/2013   Narcolepsy    OSA on CPAP    Sinus infection    Weight gain 03/2014   Cushing's??  Work up    Past Surgical History:  Procedure Laterality Date   adenoid surgery     HYSTEROSCOPY N/A 09/07/2013   Procedure: HYSTEROSCOPY Removal of IUD arm;  Surgeon: Ezzie Buba, MD;  Location: [facility];  Service: Gynecology;  Laterality: N/A;   NASAL SINUS SURGERY  jan 2013   TONSILLECTOMY       Current Outpatient Medications on File Prior to Visit  Medication Sig Dispense Refill   Melatonin 10 MG CAPS 5 to 10 mg one hour before intended bedtime. (Patient taking differently: Take 5-10 mg by mouth at bedtime as needed. 5 to 10 mg one hour before intended bedtime.) 30 capsule 0   metFORMIN (GLUCOPHAGE) 500 MG tablet Take 500 mg by mouth daily.     metoprolol succinate (TOPROL-XL) 50 MG 24 hr tablet Take 50 mg by mouth daily.     MOUNJARO 7.5 MG/0.5ML Pen Inject 7.5 mg into the skin once a week.     rosuvastatin (CRESTOR) 10 MG tablet 4 (four) times a week.      Sodium Oxybate  ER (LUMRYZ ) 4.5 g PACK Take 6 g by mouth at bedtime. 6 grams 30 each 5   spironolactone (ALDACTONE) 100 MG tablet Take 100 mg by mouth daily.      VITAMIN D PO Take 5,000 Units by mouth daily.     No current facility-administered medications on file prior to  visit.    Allergies  Allergen Reactions   Codeine Itching    REACTION: itch insomnia   Doxycycline Swelling    Angioedema Lips swell     DIAGNOSTIC DATA (LABS, IMAGING, TESTING) - I reviewed patient records, labs, notes, testing and imaging myself where available.  Lab Results  Component Value Date   WBC 6.9 10/13/2022   HGB 15.2 10/13/2022   HCT 44.2 10/13/2022   MCV 94 10/13/2022   PLT 226 10/13/2022      Component Value Date/Time   NA 139 10/13/2022 1023   K 4.6 10/13/2022 1023   CL 100 10/13/2022 1023   CO2 25 10/13/2022 1023   GLUCOSE 106 (H) 10/13/2022 1023   BUN 11 10/13/2022 1023   CREATININE 0.78 10/13/2022 1023   CALCIUM 9.7 10/13/2022 1023   PROT 7.0 10/13/2022 1023   ALBUMIN 4.4 10/13/2022 1023   AST 20 10/13/2022 1023   ALT 23 10/13/2022 1023   ALKPHOS 71 10/13/2022 1023   BILITOT 0.3 10/13/2022 1023   GFRNONAA 95 10/24/2018 1212   GFRAA 110 10/24/2018 1212   No results found for: CHOL, HDL, LDLCALC, LDLDIRECT, TRIG, CHOLHDL No results found for: YHAJ8R No results found for: VITAMINB12 No results found for: TSH  PHYSICAL EXAM:  Today's Vitals   04/22/23 1107  BP: 128/78  Pulse: 83  Weight: (!) 332 lb (150.6 kg)  Height: 5' 7 (1.702 m)   Body mass index is 52 kg/m.   Wt Readings from Last 3 Encounters:  04/22/23 (!) 332 lb (  150.6 kg)  10/13/22 (!) 360 lb (163.3 kg)  07/15/22 (!) 347 lb 6.4 oz (157.6 kg)     Ht Readings from Last 3 Encounters:  04/22/23 5' 7 (1.702 m)  10/13/22 5' 7 (1.702 m)  07/15/22 5' 7 (1.702 m)      General: The patient is awake, alert and appears not in acute distress. Well developed, in no acute distress.  no ankle edema, no intention tremor.    Neck size ; 16.75    Mallampati; 1-2 , uvula is laterally crowded.    Neurological examination  Mentation: Alert oriented to time, place, history taking. In good mood, cooperative.  Follows all commands speech and language  fluent. Cranial nerve : intact smell and taste sense-  Pupils are equally round and of equal size, promptly reactive to light and accomodation.   Uvula and tongue move in midline.  Head turning and shoulder shrug were normal and symmetric. Motor: Symmetric motor tone is noted throughout. Equal bulk, equal ROM on upper extremities.  Sensory: testing is intact to vibration.  No evidence of extinction is noted.  Coordination: no changing in penman ship. 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   Gait and station: Gait is of wider base -Tandem gait is normal. No drift is seen.  Reflexes: Deep tendon reflexes are symmetrically attenuated bilaterally.     ASSESSMENT AND PLAN 43 y.o. year old female  here with:  Narcolepsy, OSA , high fatigue and recently dx with DM2.     1) persistent fatigue of other origin , not related to OSA ( AHI is and now well controlled,) not related to Hypoxia.   2 EDS improved under LUMRYZ  , in comparison to [name]   will adjust the dose further or the intake time first we discussed again to split in half, but given that she can fall asleep so easily , I will adjust the intake time to 11-12 PM.   Keep 6 gram dose- no sleep walking.   3) continue OSA on CPAP.   4) IDM, now taking Mounjaro, lost 20 pounds.   Her main risk for sleep apnea is her weight, so this is great.  Antiinflammatory effect will benefit her overall health and hopefully improve fatigue.    I plan to follow up either personally or through our NP within 12 months.   I would like to thank  Stephane Leita DEL, Md [address] [place],  [place] [address] for allowing me to meet with and to take care of this pleasant patient.    After spending a total time of  35  minutes face to face and additional time for physical and neurologic examination, review of laboratory studies,  personal review of imaging studies, reports and results of other testing and review of referral information /  records as far as provided in visit,   Electronically signed by: Dedra Gores, MD 04/22/2023 11:29 AM  [facility] Board certified by The [employer] of [employer] Medicine and Diplomate of the [employer] of [employer]. Board certified In Neurology through the ABPN, Fellow of the [employer] of Neu[employer].

## 2023-07-26 ENCOUNTER — Telehealth: Payer: Self-pay | Admitting: Neurology

## 2023-07-26 ENCOUNTER — Telehealth: Payer: Self-pay

## 2023-07-26 ENCOUNTER — Other Ambulatory Visit (HOSPITAL_COMMUNITY): Payer: Self-pay

## 2023-07-26 NOTE — Telephone Encounter (Signed)
 [facility] Pharmacist, hospital requesting a updated clinicals for the Prior Authorization renewals for Sodium Oxybate ER (LUMRYZ) 4.5 g PACK    Fax to: [phone]

## 2023-07-26 NOTE — Telephone Encounter (Signed)
 Pharmacy Patient Advocate Encounter   Received notification from Physician's Office that prior authorization for Lumryz 4.5GM packets is required/requested.   Insurance verification completed.   The patient is insured through [facility] .   Per test claim: PA required; PA submitted to above mentioned insurance via CoverMyMeds Key/confirmation #/EOC [id number] Status is pending

## 2023-07-27 NOTE — Telephone Encounter (Signed)
 Pharmacy Patient Advocate Encounter   Received notification from Physician's Office that prior authorization for Lumryz 4.5GM packets is required/requested.   Insurance verification completed.   The patient is insured through [facility] .   Per test claim: PA required; PA submitted to above mentioned insurance via CoverMyMeds Key/confirmation #/EOC [id number] Status is pending

## 2023-07-27 NOTE — Telephone Encounter (Signed)
 I also placed the sleep study/MSLT in the chart under the media tab for easy access for future PA's, etc.

## 2023-07-27 NOTE — Telephone Encounter (Signed)
 Do you all have a copy of the polysomnography report with the MSLT reports as well? I submitted PA without those documents because I could not find them in the chart-It was denied. If you can provide me those documents I can try to resubmit. Also not sure if they will approve due to not having documentation of PT trying Modafinil or Armodafinil.Thanks.  Pharmacy Patient Advocate Encounter  Received notification from [facility] that Prior Authorization for Lumryz 4.5GM packets has been DENIED.  Full denial letter will be uploaded to the media tab. See denial reason below.   PA #/Case ID/Reference #: PA Case ID #: [id number]

## 2023-07-27 NOTE — Telephone Encounter (Signed)
 Thank you so much-I got them-I will try to resubmit with this information if plan will allow if not will send to our pharmacist for an appeals review.

## 2023-07-29 NOTE — Telephone Encounter (Signed)
 Pharmacy Patient Advocate Encounter  Received notification from [facility] that Prior Authorization for Lumryz 4.5GM packets has been DENIED.  Full denial letter will be uploaded to the media tab. See denial reason below.   PA #/Case ID/Reference #: PA Case ID #: [id number]

## 2023-08-03 NOTE — Telephone Encounter (Signed)
 Mcakayla from [employer] called wanting to know if the PA Denial is going to be appealed. She states that if it is they are needing the fax they sent filled out, a copy of the denial letter, and  OV notes. Please advise.  Call back number is [phone]

## 2023-08-04 NOTE — Telephone Encounter (Signed)
 Forwarded to our Pharmacist for appeals review

## 2023-08-06 ENCOUNTER — Telehealth: Payer: Self-pay | Admitting: Pharmacist

## 2023-08-06 NOTE — Telephone Encounter (Signed)
 Appeal request asks a question that requires your assistance. Please see the question below and advise accordingly. The PA will not be submitted until the necessary information is received.   I am working on the appeal for Ms. Templin Lumryz  prescription. The insurance plan is requesting documentation that two or more sleep-onset REM periods (SOREMPs) were observed on a multiple sleep latency test (MSLT) performed according to standard techniques, following a normal overnight polysomnogram. A SOREMP on the preceding nocturnal polysomnogram (within 15 minutes of sleep onset) may substitute for one of the required SOREMPs on the MSLT.  I reviewed her sleep study but did not see this specific documentation. I may have overlooked it and would greatly appreciate any clarification or guidance. I want to ensure we present the strongest possible appeal.  Thank you, Dene Fines, PharmD Clinical Pharmacist  [facility]  Direct Dial: [phone]

## 2023-08-10 ENCOUNTER — Telehealth: Payer: Self-pay | Admitting: Neurology

## 2023-08-10 ENCOUNTER — Other Ambulatory Visit: Payer: Self-pay | Admitting: Neurology

## 2023-08-10 ENCOUNTER — Encounter: Payer: Self-pay | Admitting: Neurology

## 2023-08-10 MED ORDER — XYWAV 500 MG/ML PO SOLN: 6.0000 g | Freq: Every evening | ORAL | 5 refills | Status: DC

## 2023-08-10 NOTE — Telephone Encounter (Signed)
 Called the patient to discuss the denial for the lumryz . There was no answer. LVM advising the pt to call back to further discuss.   Spoke with Dr Albertina Hugger and she is agreeable to switching the patient to Xywav  if she is agreeable to this change.will discuss when she returns call.

## 2023-08-10 NOTE — Telephone Encounter (Signed)
 PA submitted on [facility] for Xywav  [id number] Will await determine.

## 2023-08-10 NOTE — Telephone Encounter (Signed)
 Pt cld back. Discussed starting Xywav  as Lumryz  was not approved. Pt stated she wants to try medication. If medication does not help with daytime sleepiness, Pt stated she would like something to help with that. Pt stated when she goes above 6 gms on the Lumryz  she sleepwalks and at 6 gms she still does not sleep through the night and experiences daytime sleepiness. Informed Pt we will get the forms ready for her for Xywav  and if we need other info, will reach out to her. Pt stated understanding.

## 2023-08-10 NOTE — Telephone Encounter (Signed)
 Cld Pt to make her aware we need her signature on start forms for new med Xywav . No answer, LVM for call back.  When Pt calls back, please let her know we need her signature and ask if she would like to come in to sign or if she wants them sent to her to print, sign and send back. If she wants them sent, please get the way she would like to receive the forms, either email and what address or in MyChart.

## 2023-08-12 NOTE — Telephone Encounter (Signed)
 PA approved for the pt Request Reference Number: [id number]. XYWAV  SOL 0.5GM/ML is approved through 08/09/2024. Your patient may now fill this prescription and it will be covered.. Authorization Expiration Date: August 09, 2024.

## 2023-08-27 ENCOUNTER — Telehealth: Payer: Self-pay | Admitting: Neurology

## 2023-08-27 NOTE — Telephone Encounter (Signed)
 phamacist Vickie / [facility] is asking to speak with RN or MA re: medication interaction approval  the concern is XYWAV  and pt having sleep apnea and being on a CPAP. Please return call to [phone]

## 2023-08-30 NOTE — Telephone Encounter (Signed)
 Called and spoke to the pharmacist and gave the approval for the medication to be shipped as Dr Albertina Hugger is aware of the patient OSA and CPAP

## 2023-09-30 ENCOUNTER — Encounter: Payer: Self-pay | Admitting: Neurology

## 2023-10-01 ENCOUNTER — Encounter: Payer: Self-pay | Admitting: Neurology

## 2023-10-01 ENCOUNTER — Telehealth: Admitting: Neurology

## 2023-10-01 DIAGNOSIS — G4710 Hypersomnia, unspecified: Secondary | ICD-10-CM | POA: Diagnosis not present

## 2023-10-01 DIAGNOSIS — G4712 Idiopathic hypersomnia without long sleep time: Secondary | ICD-10-CM

## 2023-10-01 DIAGNOSIS — G4733 Obstructive sleep apnea (adult) (pediatric): Secondary | ICD-10-CM | POA: Diagnosis not present

## 2023-10-01 DIAGNOSIS — E8881 Metabolic syndrome: Secondary | ICD-10-CM

## 2023-10-01 MED ORDER — XYWAV 500 MG/ML PO SOLN: 6.0000 g | Freq: Every evening | ORAL | 5 refills | Status: DC

## 2023-10-01 MED ORDER — AMPHETAMINE-DEXTROAMPHETAMINE 10 MG PO TABS: 10.0000 mg | ORAL_TABLET | Freq: Every day | ORAL | 0 refills | Status: AC

## 2023-10-01 NOTE — Patient Instructions (Signed)
 Amphetamine; Dextroamphetamine Tablets What is this medication? AMPHETAMINE; DEXTROAMPHETAMINE (am FET a meen; dex troe am FET a meen) treats attention-deficit hyperactivity disorder (ADHD). It works by improving focus and reducing impulsive behavior. It may also be used to treat narcolepsy. It works by promoting wakefulness. It belongs to a group of medications called stimulants. This medicine may be used for other purposes; ask your health care provider or pharmacist if you have questions. COMMON BRAND NAME(S): Adderall What should I tell my care team before I take this medication? They need to know if you have any of these conditions: Anxiety or panic attacks Circulation problems in fingers or toes (Raynaud syndrome) Glaucoma Heart attack Heart disease High blood pressure Kidney disease Liver disease Mental health conditions Seizures Stroke Substance use disorder Suicidal thoughts, plans, or attempt by you or a family member Thyroid disease Tourette syndrome An unusual or allergic reaction to dextroamphetamine, other medications, foods, dyes, or preservatives Pregnant or trying to get pregnant Breastfeeding How should I use this medication? Take this medication by mouth. Take it as directed on the prescription label at the same time every day. You can take it with or without food. If it upsets your stomach, take it with food. Keep taking it unless your care team tells you to stop. A special MedGuide will be given to you by the pharmacist with each prescription and refill. Be sure to read this information carefully each time. Talk to your care team about the use of this medication in children. While it may be prescribed for children as young as 3 years for selected conditions, precautions do apply. Overdosage: If you think you have taken too much of this medicine contact a poison control center or emergency room at once. NOTE: This medicine is only for you. Do not share this medicine  with others. What if I miss a dose? If you miss a dose, take it as soon as you can. If it is almost time for your next dose, take only that dose. Do not take double or extra doses. What may interact with this medication? Do not take this medication with any of the following: Linezolid MAOIs, such as Marplan, Nardil, and Parnate Methylene blue This medication may also interact with the following: Acetazolamide Alcohol Ascorbic acid Certain medications for depression, anxiety, or other mental health conditions Certain medications for migraines, such as sumatriptan Guanethidine Opioids Reserpine Sodium bicarbonate St. John's wort Thiazide diuretics, such as chlorothiazide Tryptophan This list may not describe all possible interactions. Give your health care provider a list of all the medicines, herbs, non-prescription drugs, or dietary supplements you use. Also tell them if you smoke, drink alcohol, or use illegal drugs. Some items may interact with your medicine. What should I watch for while using this medication? Visit your care team for regular checks on your progress. Tell your care team if your symptoms do not start to get better or if they get worse. This medication requires a new prescription from your care team every time it is filled at the pharmacy. This medication can be abused and cause your brain and body to depend on it after high doses or long term use. Your care team will assess your risk and monitor you closely during treatment. Long term use of this medication may cause your brain and body to depend on it. You may be able to take breaks from this medication during weekends, holidays, or summer vacations. Talk to your care team about what works for you. If  your care team wants you to stop this medication permanently, the dose may be slowly lowered over time to reduce the risk of side effects. Tell your care team if this medication loses its effects, or if you feel you need  to take more than the prescribed amount. Do not change your dose without talking to your care team. Do not take this medication close to bedtime. It may prevent you from sleeping. Loss of appetite is common when starting this medication. Eating small, frequent meals or snacks can help. Talk to your care team if appetite loss persists. Children should have height and weight checked often while taking this medication. Tell your care team right away if you notice unexplained wounds on your fingers and toes while taking this medication. You should also tell your care team if you experience numbness or pain, changes in the skin color, or sensitivity to temperature in your fingers or toes. Contact your care team right away if you have an erection that lasts longer than 4 hours or if it becomes painful. This may be a sign of a serious problem and must be treated right away to prevent permanent damage. What side effects may I notice from receiving this medication? Side effects that you should report to your care team as soon as possible: Allergic reactions--skin rash, itching, hives, swelling of the face, lips, tongue, or throat Heart attack--pain or tightness in the chest, shoulders, arms, or jaw, nausea, shortness of breath, cold or clammy skin, feeling faint or lightheaded Heart rhythm changes--fast or irregular heartbeat, dizziness, feeling faint or lightheaded, chest pain, trouble breathing Increase in blood pressure Irritability, confusion, fast or irregular heartbeat, muscle stiffness, twitching muscles, sweating, high fever, seizure, chills, vomiting, diarrhea, which may be signs of serotonin syndrome Mood and behavior changes--anxiety, nervousness, confusion, hallucinations, irritability, hostility, thoughts of suicide or self-harm, worsening mood, feelings of depression Prolonged or painful erection Raynaud syndrome--cool, numb, or painful fingers or toes that may change color from pale, to blue, to  red Seizures Stroke--sudden numbness or weakness of the face, arm, or leg, trouble speaking, confusion, trouble walking, loss of balance or coordination, dizziness, severe headache, change in vision Side effects that usually do not require medical attention (report these to your care team if they continue or are bothersome): Dry mouth Headache Loss of appetite with weight loss Nausea Stomach pain Trouble sleeping This list may not describe all possible side effects. Call your doctor for medical advice about side effects. You may report side effects to FDA at 1-800-FDA-1088. Where should I keep my medication? Keep out of the reach of children and pets. This medication can be abused. Keep it in a safe place to protect it from theft. Do not share it with anyone. It is only for you. Selling or giving away this medication is dangerous and against the law. Store at room temperature between 20 and 25 degrees C (68 and 77 degrees F). Protect from light and moisture. Keep container tightly closed. Get rid of any unused medication after the expiration date. This medication may cause harm and death if it is taken by other adults, children, or pets. It is important to get rid of the medication as soon as you no longer need it, or it is expired. You can do this in two ways: Take the medication to a medication take-back program. Check with your pharmacy or law enforcement to find a location. If you cannot return the medication, check the label or package insert to see  if the medication should be thrown out in the garbage or flushed down the toilet. If you are not sure, ask your care team. If it is safe to put it in the trash, take the medication out of the container. Mix the medication with cat litter, dirt, coffee grounds, or other unwanted substance. Seal the mixture in a bag or container. Put it in the trash. NOTE: This sheet is a summary. It may not cover all possible information. If you have questions about  this medicine, talk to your doctor, pharmacist, or health care provider.  2024 Elsevier/Gold Standard (2023-03-19 00:00:00)

## 2023-10-01 NOTE — Progress Notes (Signed)
 Karen Hendricks

## 2023-10-01 NOTE — Progress Notes (Addendum)
 Provider:  Dedra Gores, MD  Primary Care Physician:  Stephane Leita DEL, MD [address] [place] [address]     Referring Provider: Stephane Leita DEL, Md [address] [place],  [place] [address]          Chief Complaint according to patient   Patient presents with:             This patient was seen in a video visit on 10-01-2023, with the [facility] provider at her office and Miss Feutz at her home-  the patient is well established and agreed to a video visit, her identiy was confirmed by the provider. We addressed the following issues;   Hypersomnia,  Narcolepsy.   Morbid obesity and OSA on CPAP.     HISTORY OF PRESENT ILLNESS:  Karen Hendricks is a 43 y.o. female patient who is here for revisit 10/01/2023 for  check up on CPAP , compliance is 100% and  Narcolepsy , XYWAV  medication tolerance is good, higher  doses caused sleep walking.  Spironolactone  should be taking in evening to avoid nocturia.  Sleep is for the first 3 hours uninterrupted,  then has many arousals later throughout the night.  On Mounjaro ,  Endocrinologist  prescribed -  prediabetes.   Chief concern according to patient :  Needs XYWAV  refills .  6 gram at bedtime  , no second dose.   Insurance wouldn't cover [employer].     Review of Systems: Out of a complete 14 system review, the patient complains of only the following symptoms, and all other reviewed systems are negative.:   How likely are you to doze in the following situations: 0 = not likely, 1 = slight chance, 2 = moderate chance, 3 = high chance  Sitting and Reading? Watching Television? Sitting inactive in a public place (theater or meeting)? Lying down in the afternoon when circumstances permit? Sitting and talking to someone? Sitting quietly after lunch without alcohol? In a car, while stopped for a few minutes in traffic? As a passenger in a car for an hour without a break?  Total = 10 points    Modafinil / Armodafinil did make her  sick.   Ritalin in elementary school  Adderall  for years in schoool, HS. ADHD.          Social History   Socioeconomic History   Marital status: Married    Spouse name: norleen   Number of children: 1   Years of education: 12   Highest education level: Not on file  Occupational History   Occupation: Programmer, multimedia: [employer]  Tobacco Use   Smoking status: Every Day    Current packs/day: 1.00    Average packs/day: 1 pack/day for 21.0 years (21.0 ttl pk-yrs)    Types: Cigarettes   Smokeless tobacco: Never  Substance and Sexual Activity   Alcohol use: Yes    Alcohol/week: 0.0 standard drinks of alcohol    Comment: rare   Drug use: No   Sexual activity: Not on file  Other Topics Concern   Not on file  Social History Narrative   Not on file   Social Drivers of Health   Financial Resource Strain: Not on file  Food Insecurity: Not on file  Transportation Needs: Not on file  Physical Activity: Not on file  Stress: Not on file  Social Connections: Not on file    Family  History  Problem Relation Age of Onset   Hypertension Mother    Psoriasis Father     Past Medical History:  Diagnosis Date   Hidradenitis suppurativa    Hypersomnia    Hypersomnia, persistent 04/17/2013   Persistent hypersomnia on CPAP, abnormal MSLT - XYREM  controls hypersomnia now, remaining on CPAP.    IUD mechanical complication 09/05/2013   Narcolepsy    OSA on CPAP    Sinus infection    Weight gain 03/2014   Cushing's??  Work up    Past Surgical History:  Procedure Laterality Date   adenoid surgery     HYSTEROSCOPY N/A 09/07/2013   Procedure: HYSTEROSCOPY Removal of IUD arm;  Surgeon: Ezzie Buba, MD;  Location: [facility];  Service: Gynecology;  Laterality: N/A;   NASAL SINUS SURGERY  jan 2013   TONSILLECTOMY       Current Outpatient Medications on File Prior to Visit  Medication Sig Dispense Refill   Ca, Mg, K, and Na Oxybates (XYWAV ) 500  MG/ML SOLN Take 6 g by mouth at bedtime. 300 mL 5   Melatonin 10 MG CAPS 5 to 10 mg one hour before intended bedtime. (Patient taking differently: Take 5-10 mg by mouth at bedtime as needed. 5 to 10 mg one hour before intended bedtime.) 30 capsule 0   metFORMIN (GLUCOPHAGE) 500 MG tablet Take 500 mg by mouth daily.     metoprolol succinate (TOPROL-XL) 50 MG 24 hr tablet Take 50 mg by mouth daily.     MOUNJARO 7.5 MG/0.5ML Pen Inject 7.5 mg into the skin once a week.     rosuvastatin (CRESTOR) 10 MG tablet 4 (four) times a week.      spironolactone (ALDACTONE) 100 MG tablet Take 100 mg by mouth daily.      VITAMIN D PO Take 5,000 Units by mouth daily.     No current facility-administered medications on file prior to visit.    Allergies  Allergen Reactions   Codeine Itching    REACTION: itch insomnia   Doxycycline Swelling    Angioedema Lips swell     DIAGNOSTIC DATA (LABS, IMAGING, TESTING) - I reviewed patient records, labs, notes, testing and imaging myself where available.  Lab Results  Component Value Date   WBC 6.9 10/13/2022   HGB 15.2 10/13/2022   HCT 44.2 10/13/2022   MCV 94 10/13/2022   PLT 226 10/13/2022      Component Value Date/Time   NA 139 10/13/2022 1023   K 4.6 10/13/2022 1023   CL 100 10/13/2022 1023   CO2 25 10/13/2022 1023   GLUCOSE 106 (H) 10/13/2022 1023   BUN 11 10/13/2022 1023   CREATININE 0.78 10/13/2022 1023   CALCIUM 9.7 10/13/2022 1023   PROT 7.0 10/13/2022 1023   ALBUMIN 4.4 10/13/2022 1023   AST 20 10/13/2022 1023   ALT 23 10/13/2022 1023   ALKPHOS 71 10/13/2022 1023   BILITOT 0.3 10/13/2022 1023   GFRNONAA 95 10/24/2018 1212   GFRAA 110 10/24/2018 1212   No results found for: CHOL, HDL, LDLCALC, LDLDIRECT, TRIG, CHOLHDL No results found for: YHAJ8R No results found for: VITAMINB12 No results found for: TSH  PHYSICAL EXAM:  There were no vitals filed for this visit. No data found. There is no height or weight  on file to calculate BMI.   Wt Readings from Last 3 Encounters:  04/22/23 (!) 332 lb (150.6 kg)  10/13/22 (!) 360 lb (163.3 kg)  07/15/22 (!) 347 lb 6.4 oz (157.6  kg)     Ht Readings from Last 3 Encounters:  04/22/23 5' 7 (1.702 m)  10/13/22 5' 7 (1.702 m)  07/15/22 5' 7 (1.702 m)      General: The patient is awake, alert and appears not in acute distress. The patient is groomed. NEUROLOGIC EXAM: The patient is awake and alert, oriented to place and time.   Memory subjective described as intact.  Attention span & concentration ability appears normal.  Speech is fluent,  without  dysarthria, dysphonia or aphasia.  Mood and affect are appropriate.   Cranial nerves: no loss of smell or taste reported     ASSESSMENT AND PLAN :   43 y.o. year old female  here with:    1)  Epworth Sleepiness  Score -residual sleepiness 10-11 / points and will add Adderall   10 mg  immediate release po for prn.    2)  weight loss , on Mounjaro , 7.5 mg - lost a total 40 pounds  290 lbs now form 332 lbs.  This will change the need for  CPAP pressure.    Refilled  Xywav ;  RV in 6 months  for refills.    As discussed, Xyrem / Xywav   has to be taken with very mindful caution: Taking Xyrem  / Xywav  correctly is key. This means, take it only when you are fully ready to fall asleep, while in bed and refrain from doing any other activities, even brushing  your teeth after taking your first dose. The second dose will be about 2-1/2-4 hours after his first dose. You can go to the bathroom before your 2nd dose.  Take your first dose, when actually IN BED, ready to sleep.  No sitting up in bed, NO reading, NO using the cell phone or computer, NO getting up to use the bathroom. Take care of everything BEFORE sleep time. Try NOT to skip the second dose as the Xyrem  is not going to stay in your system long enough with only one dose. Do not drink alcohol with Xyrem .  If you do drink Alcohol, you cannot take your  Xyrem  doses that night.  .   This patient was seen in a video visit on 10-01-2023, with the [facility] provider at her office and Miss Bakula at her home-  the patient is well established and agreed to a video visit, her identiy was confirmed by the provider. We addressed the following issues;   Hypersomnia,  Narcolepsy.   Morbid obesity and OSA on CPAP.   I would like to thank  Stephane Leita DEL, Md [address] [place],  [place] [address] for allowing me to meet with and to take care of this pleasant patient.   The patient's condition requires frequent monitoring and adjustments in the treatment plan, reflecting the ongoing complexity of care.  This provider is the continuing focal point for all needed services for this condition.  After spending a total time of  14  minutes video time for history taking, physical and neurologic examination, review of laboratory studies,  personal review of imaging studies, reports and results of other testing and review of referral information / records as far as provided in visit,   Electronically signed by: Dedra Gores, MD 10/01/2023 10:47 AM  [facility] Board certified by The [employer] of Sleep Medicine and Diplomate of the [employer] of [employer]. Board certified In Neurology through the ABPN, Fellow of the [employer] of [employer].

## 2023-10-14 ENCOUNTER — Telehealth: Payer: No Typology Code available for payment source | Admitting: Neurology

## 2024-02-24 ENCOUNTER — Telehealth: Payer: Self-pay | Admitting: Neurology

## 2024-02-24 ENCOUNTER — Encounter: Payer: Self-pay | Admitting: Neurology

## 2024-02-24 NOTE — Telephone Encounter (Signed)
 Pt is requesting a refill for Ca, Mg, K, and Na Oxybates (XYWAV ) 500 MG/ML SOLN.  Pharmacy: [facility] [phone] option 3 then option 4

## 2024-02-24 NOTE — Telephone Encounter (Signed)
 Rx sheet filled out and placed in Dr. Lionell office in sign bin for her to sign.

## 2024-02-27 MED ORDER — XYWAV 500 MG/ML PO SOLN: 6.0000 g | Freq: Every evening | ORAL | 5 refills | Status: AC

## 2024-02-28 NOTE — Telephone Encounter (Signed)
 Signed xywav  prescription faxed to [facility]. Received a receipt of confirmation.
